# Patient Record
Sex: Male | Born: 1985
Health system: Southern US, Community
[De-identification: ages and names within clinical notes are randomized; demographics above are authoritative.]

## PROBLEM LIST (undated history)

## (undated) DIAGNOSIS — F32A Depression, unspecified: Secondary | ICD-10-CM

## (undated) DIAGNOSIS — F419 Anxiety disorder, unspecified: Secondary | ICD-10-CM

## (undated) HISTORY — PX: HIP ARTHROPLASTY: SHX981

## (undated) HISTORY — PX: WISDOM TOOTH EXTRACTION: SHX21

## (undated) HISTORY — PX: TEAR DUCT PROBING: SHX793

## (undated) HISTORY — PX: ADENOIDECTOMY: SUR15

## (undated) HISTORY — PX: TONSILLECTOMY: SUR1361

---

## 2013-10-20 ENCOUNTER — Other Ambulatory Visit (HOSPITAL_BASED_OUTPATIENT_CLINIC_OR_DEPARTMENT_OTHER): Payer: Self-pay | Admitting: Family Medicine

## 2013-10-20 DIAGNOSIS — R109 Unspecified abdominal pain: Secondary | ICD-10-CM

## 2013-10-21 ENCOUNTER — Ambulatory Visit (HOSPITAL_BASED_OUTPATIENT_CLINIC_OR_DEPARTMENT_OTHER)
Admission: RE | Admit: 2013-10-21 | Discharge: 2013-10-21 | Disposition: A | Discharge status: Home / Self Care | Payer: BC Managed Care – PPO | Source: Ambulatory Visit | Attending: Family Medicine | Admitting: Family Medicine

## 2013-10-21 DIAGNOSIS — R109 Unspecified abdominal pain: Secondary | ICD-10-CM

## 2013-10-21 DIAGNOSIS — I861 Scrotal varices: Secondary | ICD-10-CM | POA: Insufficient documentation

## 2016-09-17 DIAGNOSIS — M25552 Pain in left hip: Secondary | ICD-10-CM | POA: Insufficient documentation

## 2017-12-25 ENCOUNTER — Ambulatory Visit
Admission: RE | Admit: 2017-12-25 | Discharge: 2017-12-25 | Disposition: A | Discharge status: Home / Self Care | Payer: BC Managed Care – PPO | Source: Ambulatory Visit | Attending: Family Medicine | Admitting: Family Medicine

## 2017-12-25 ENCOUNTER — Ambulatory Visit: Payer: BC Managed Care – PPO | Admitting: Family Medicine

## 2017-12-25 ENCOUNTER — Encounter: Payer: Self-pay | Admitting: Family Medicine

## 2017-12-25 VITALS — BP 128/87 | Ht 73.0 in | Wt 235.0 lb

## 2017-12-25 DIAGNOSIS — M25532 Pain in left wrist: Secondary | ICD-10-CM

## 2017-12-25 DIAGNOSIS — M79642 Pain in left hand: Secondary | ICD-10-CM | POA: Diagnosis not present

## 2017-12-25 NOTE — Patient Instructions (Signed)
[facility] - we will call you with the results. Assuming these are normal you're dealing with a muscle strain at that base of your thumb. Wear the thumb spica brace as often as possible for the next 4-6 weeks. Aleve 2 tabs twice a day with food OR ibuprofen 600mg  three times a day with food - take 7-10 days [place] as needed. Activities, sports as tolerated - ok for hockey as well. Icing if needed 15 minutes at a time. Consider occupational therapy if not improving as expected. Follow up in 1 month to 6 weeks.

## 2017-12-27 ENCOUNTER — Encounter: Payer: Self-pay | Admitting: Family Medicine

## 2017-12-27 DIAGNOSIS — M79642 Pain in left hand: Secondary | ICD-10-CM | POA: Insufficient documentation

## 2017-12-27 NOTE — Assessment & Plan Note (Signed)
independently reviewed radiographs and no evidence fracture.  Consistent with muscle strain.  Thumb spica brace to rest this for 4-6 weeks.  Aleve or ibuprofen.  Activities as tolerated.  Icing if needed.  Consider occupational therapy if struggling.  F/u in 1 month to 6 weeks.

## 2017-12-27 NOTE — Progress Notes (Signed)
PCP: Joycelyn RuaMeyers, Stephen, MD  Subjective:   HPI: Patient is a 32 y.o. male here for left hand pain.  Patient denies known injury or trauma. He states for about 1 month he's had soreness in left hand between thumb and index finger metacarpals.   Bothers him more with hockey, squeezing things and gripping. Taken ibuprofen as needed with mild benefit. No skin changes, numbness.  History reviewed. No pertinent past medical history.  No current outpatient medications on file prior to visit.   No current facility-administered medications on file prior to visit.     History reviewed. No pertinent surgical history.  No Known Allergies  Social History   Socioeconomic History  . Marital status: Married    Spouse name: Not on file  . Number of children: Not on file  . Years of education: Not on file  . Highest education level: Not on file  Occupational History  . Not on file  Social Needs  . Financial resource strain: Not on file  . Food insecurity:    Worry: Not on file    Inability: Not on file  . Transportation needs:    Medical: Not on file    Non-medical: Not on file  Tobacco Use  . Smoking status: Never Smoker  . Smokeless tobacco: Never Used  Substance and Sexual Activity  . Alcohol use: Not on file  . Drug use: Not on file  . Sexual activity: Not on file  Lifestyle  . Physical activity:    Days per week: Not on file    Minutes per session: Not on file  . Stress: Not on file  Relationships  . Social connections:    Talks on phone: Not on file    Gets together: Not on file    Attends religious service: Not on file    Active member of club or organization: Not on file    Attends meetings of clubs or organizations: Not on file    Relationship status: Not on file  . Intimate partner violence:    Fear of current or ex partner: Not on file    Emotionally abused: Not on file    Physically abused: Not on file    Forced sexual activity: Not on file  Other Topics  Concern  . Not on file  Social History Narrative  . Not on file    History reviewed. No pertinent family history.  BP 128/87   Ht 6\' 1"  (1.854 m)   Wt 235 lb (106.6 kg)   BMI 31.00 kg/m   Review of Systems: See HPI above.     Objective:  Physical Exam:  Gen: NAD, comfortable in exam room  Left hand: No gross deformity, swelling, bruising. TTP dorsally mildly between 1st and 2nd metacarpals within musculature. FROM thumb, digits with 5/5 strength including grip though this is mildly painful. Negative tinels and finkelsteins. NVI distally.  Right hand: No deformity. FROM with 5/5 strength. No tenderness to palpation. NVI distally.   Assessment & Plan:  1. Left hand pain - independently reviewed radiographs and no evidence fracture.  Consistent with muscle strain.  Thumb spica brace to rest this for 4-6 weeks.  Aleve or ibuprofen.  Activities as tolerated.  Icing if needed.  Consider occupational therapy if struggling.  F/u in 1 month to 6 weeks.

## 2018-09-30 ENCOUNTER — Ambulatory Visit (INDEPENDENT_AMBULATORY_CARE_PROVIDER_SITE_OTHER): Payer: 59 | Admitting: Family Medicine

## 2018-09-30 ENCOUNTER — Encounter: Payer: Self-pay | Admitting: Family Medicine

## 2018-09-30 VITALS — BP 132/86 | Ht 73.0 in | Wt 235.0 lb

## 2018-09-30 DIAGNOSIS — M25552 Pain in left hip: Secondary | ICD-10-CM | POA: Diagnosis not present

## 2018-09-30 NOTE — Patient Instructions (Signed)
You have a labral tear of your hip, less likely femoroacetabular impingement. If you want to take additional steps just let me know - would be going ahead with x-rays and physical therapy. If you didn't respond to therapy would Traore go ahead with MR arthrogram to confirm labral tear. Call me if you need anything otherwise follow up as needed.

## 2018-09-30 NOTE — Progress Notes (Signed)
   HPI  CC: Left hip pain Stuart Wade is a 33 year old male who presents for left hip pain.  He states is been going on for around a year and a half.  He states he stopped playing hockey a year and a half ago, due to pain.  He states he notices the pain in his groin on the left.  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  He does not notice the pain much outside of playing hockey.  He states he notices it mostly when he goes to do a hockey stop, which puts his leg and external rotation.  He also notices it when he does a butterfly stretch.  A year and a half ago he started PT, which he stated helped with the pain.  He stopped hockey until 3 weeks ago.  At that time he started playing goalie again, which she states brought the pain on again.  He states he did notice it when he skated outside of playing goalie, but the pain went away after he warmed up a little bit.  He is tried ibuprofen occasionally, which states does not help much.  He has not noticed the pain when he puts his leg in full extension when he skates.  He has not noticed the pain at nighttime.  He denies any weakness in the leg.  He denies any numbness and tingling down his leg.  He has no prior injury to this area.  The pain does not radiate to the parts of his body.  See HPI and/or previous note for associated ROS.  Objective: BP 132/86   Ht 6\' 1"  (1.854 m)   Wt 235 lb (106.6 kg)   BMI 31.00 kg/m  Gen: right-Hand Dominant. NAD, well groomed, a/o x3, normal affect.  CV: Well-perfused. Warm.  Resp: Non-labored.  Neuro: Sensation intact throughout. No gross coordination deficits.  Gait: Nonpathologic posture, unremarkable stride without signs of limp or balance issues.  Left hip exam: No erythema, warmth, swelling noted.  Tenderness palpation over the groin at the inguinal fold.  Full range of motion in hip extension, flexion, abduction, adduction.  Strength 5 out of 5 throughout all hip testing.  Negative logroll.  Negative  straight leg raise.  Negative FABER.  Positive FADIR.  Positive hip grind test.  Positive external to internal rotation test (impingement testing).  NVI distally.  Right hip: No deformity. FROM with 5/5 strength. No tenderness to palpation. NVI distally.  Assessment and plan: Left hip pain, likely secondary to labral tear versus FAI.  We discussed treatment options at today's visit.  We recommended starting physical therapy and obtaining an x-ray of the hip.  The x-ray would be to evaluate for FAI.  Patient states he wishes to think about starting these things.  Alternatively, he is considering whether or not he wants to continue to play goalie at this time.  We will call in the order for x-rays of physical therapy if he decides to proceed this route.  If the pain does not get any better following PT/x-rays, and he wishes to go for further evaluation, I would consider getting MRI arthrogram of the left hip to evaluate the labrum.  We will see him in follow-up in 5 to 6 weeks, or as needed if he wishes to stop hockey.  Alric Quan, MD [facility] Sports Medicine Fellow 09/30/2018 11:59 AM

## 2019-03-29 IMAGING — CR DG WRIST COMPLETE 3+V*L*
4 series · 4 of 4 positions shown · non-contrast
Comparison: None

CLINICAL DATA: LEFT wrist pain for 1 month, no specific injury

EXAM:
LEFT WRIST - COMPLETE 3+ VIEW

[x wrist pa left]
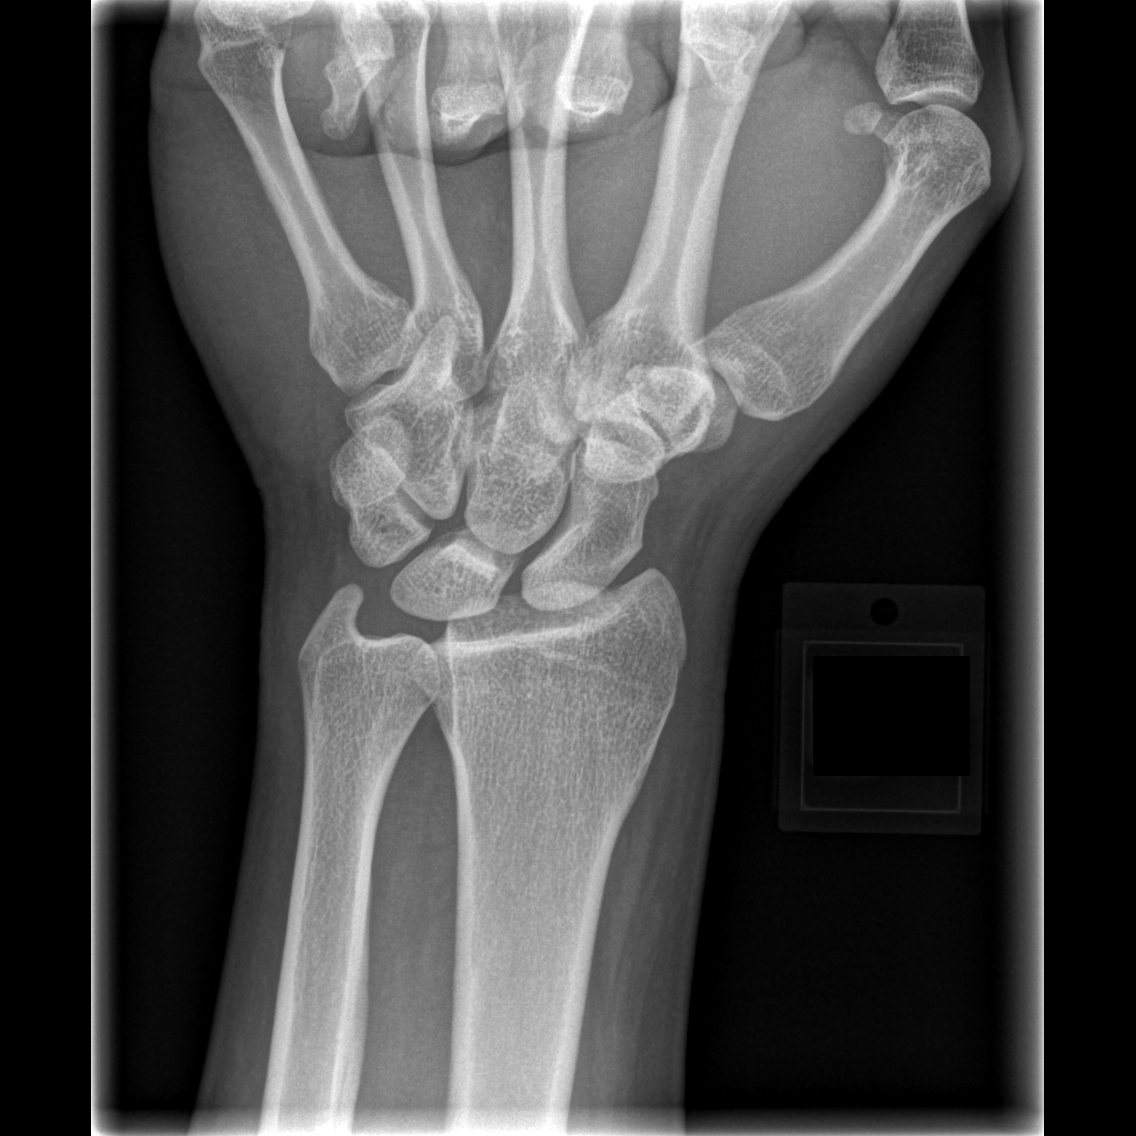

[x navicular]
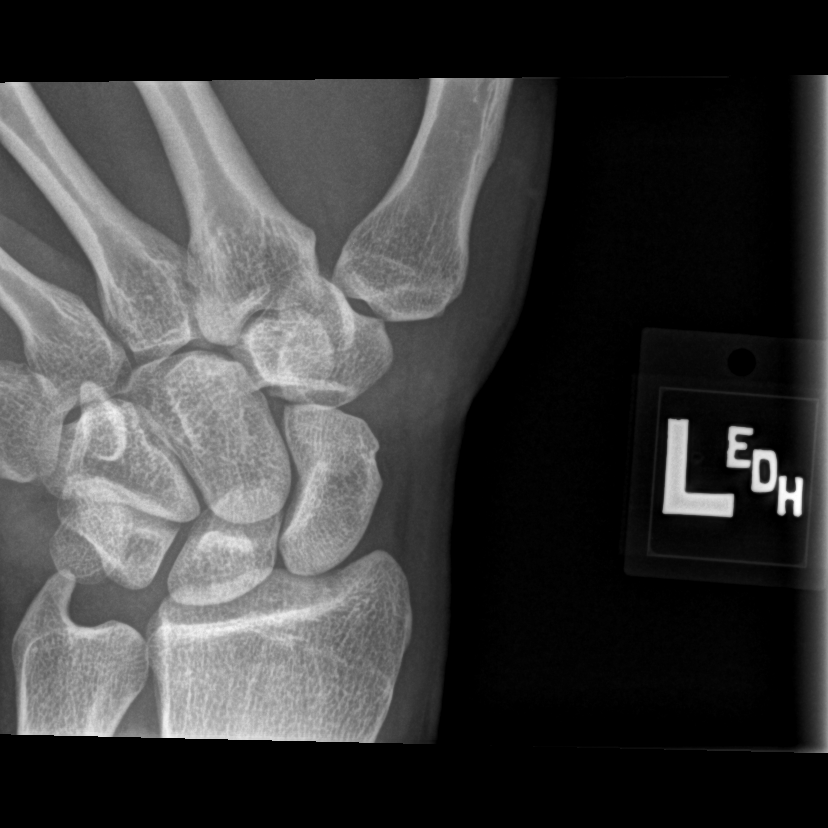

[x wrist obl left]
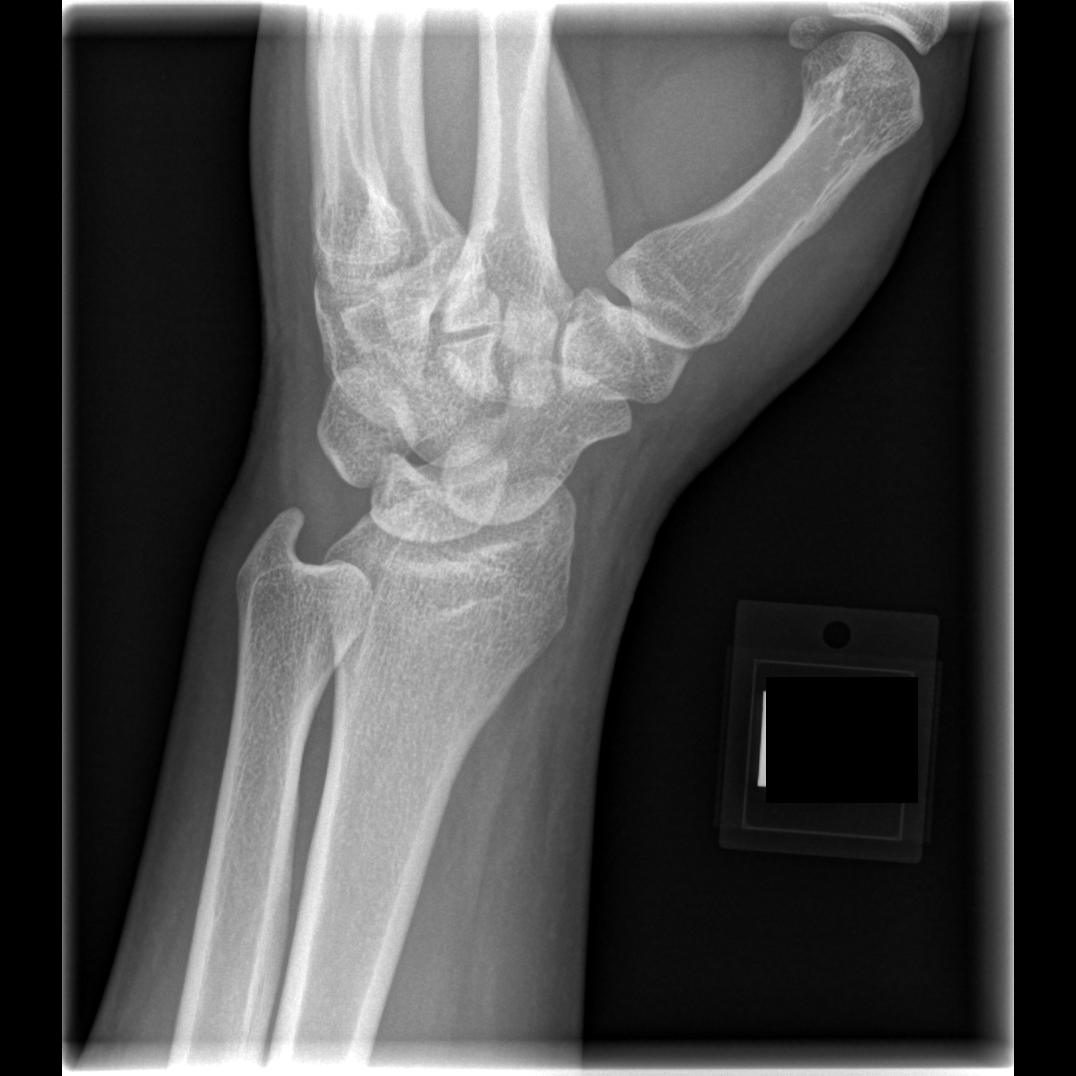

[x wrist lat left]
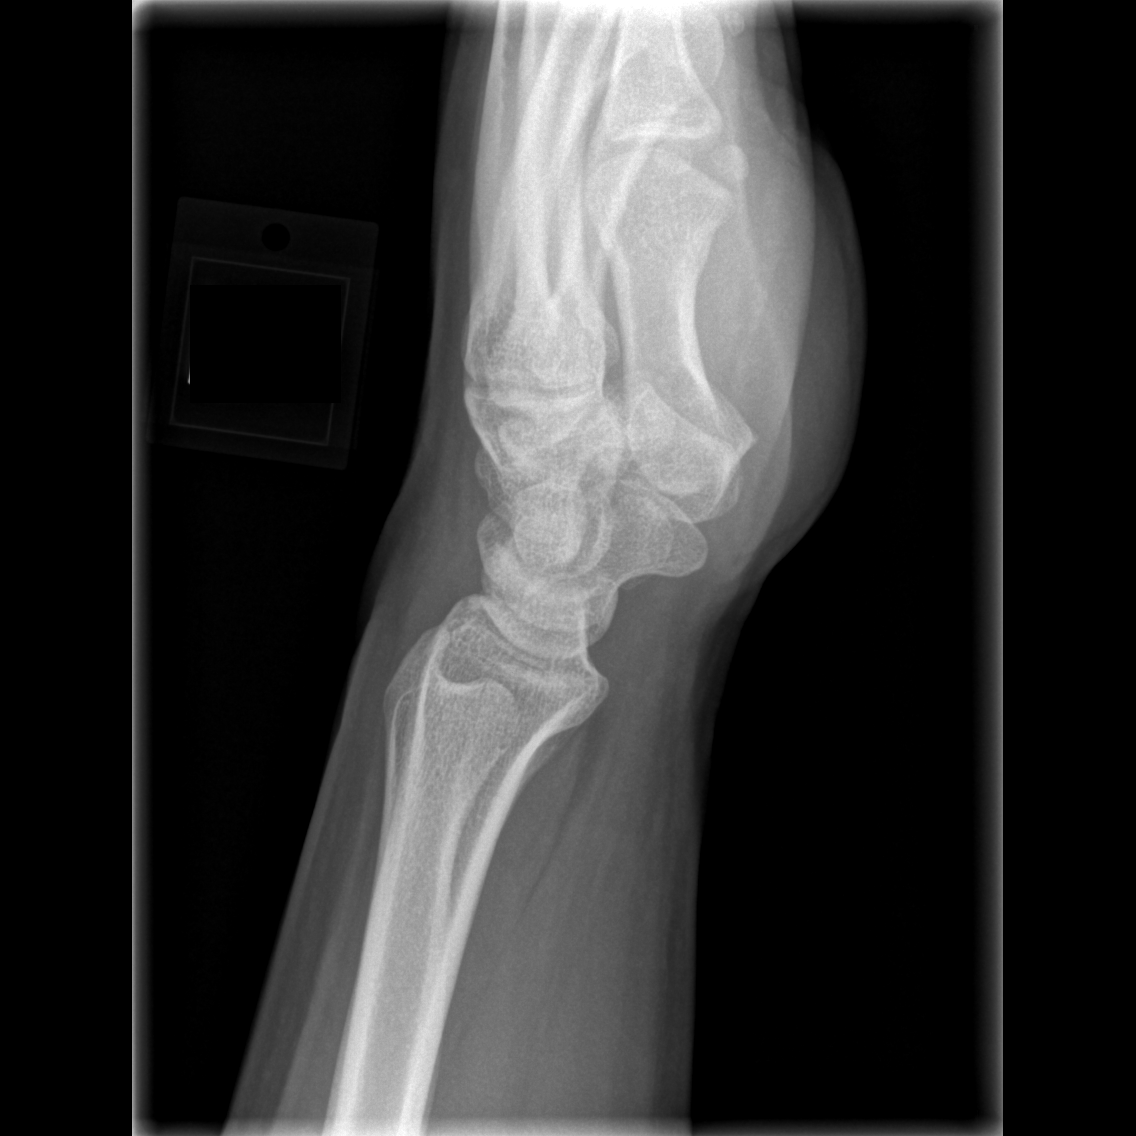

[4 of 4 positions shown; findings below may reference images not displayed]

FINDINGS: Osseous mineralization normal.

Joint spaces preserved.

No fracture, dislocation, or bone destruction.
IMPRESSION: Normal exam.

## 2020-06-01 DIAGNOSIS — Z13220 Encounter for screening for lipoid disorders: Secondary | ICD-10-CM | POA: Diagnosis not present

## 2020-06-01 DIAGNOSIS — Z23 Encounter for immunization: Secondary | ICD-10-CM | POA: Diagnosis not present

## 2020-06-01 DIAGNOSIS — Z0000 Encounter for general adult medical examination without abnormal findings: Secondary | ICD-10-CM | POA: Diagnosis not present

## 2020-10-24 DIAGNOSIS — S42025A Nondisplaced fracture of shaft of left clavicle, initial encounter for closed fracture: Secondary | ICD-10-CM | POA: Diagnosis not present

## 2020-10-24 DIAGNOSIS — G8911 Acute pain due to trauma: Secondary | ICD-10-CM | POA: Diagnosis not present

## 2020-10-24 DIAGNOSIS — M25512 Pain in left shoulder: Secondary | ICD-10-CM | POA: Diagnosis not present

## 2020-10-24 DIAGNOSIS — S42125A Nondisplaced fracture of acromial process, left shoulder, initial encounter for closed fracture: Secondary | ICD-10-CM | POA: Diagnosis not present

## 2020-10-24 DIAGNOSIS — S42035A Nondisplaced fracture of lateral end of left clavicle, initial encounter for closed fracture: Secondary | ICD-10-CM | POA: Diagnosis not present

## 2020-10-26 ENCOUNTER — Ambulatory Visit: Payer: Self-pay

## 2020-10-26 ENCOUNTER — Ambulatory Visit (INDEPENDENT_AMBULATORY_CARE_PROVIDER_SITE_OTHER): Payer: 59 | Admitting: Surgery

## 2020-10-26 ENCOUNTER — Other Ambulatory Visit: Payer: Self-pay

## 2020-10-26 ENCOUNTER — Encounter: Payer: Self-pay | Admitting: Surgery

## 2020-10-26 VITALS — BP 147/76 | HR 88 | Ht 72.75 in | Wt 220.0 lb

## 2020-10-26 DIAGNOSIS — S42022A Displaced fracture of shaft of left clavicle, initial encounter for closed fracture: Secondary | ICD-10-CM

## 2020-10-26 DIAGNOSIS — M25512 Pain in left shoulder: Secondary | ICD-10-CM

## 2020-10-26 NOTE — Progress Notes (Signed)
Office Visit Note   Patient: Stuart Wade           Date of Birth: [birth year]           MRN: [id number] Visit Date: 10/26/2020              Requested by: Joycelyn Rua, MD [id number] [place],  [place] [address] PCP: Joycelyn Rua, MD   Assessment & Plan: Visit Diagnoses:  1. Acute pain of left shoulder   2. Closed displaced fracture of shaft of left clavicle, initial encounter     Plan: I reviewed x-rays with Dr. Dorene Grebe today.  Advised patient that best treatment option for this injury is open reduction internal fixation.  Surgical procedure discussed with patient.  Preop sheet filled out.  Dr. August Saucer would like to get this put on the schedule for next week and this was arranged.  Patient will continue wearing his sling.  No using left arm.  All questions answered.  Follow-Up Instructions: Return for Need return office visit 1 week postop with Dr. August Saucer.   Orders:  Orders Placed This Encounter  Procedures  . XR Shoulder Left   No orders of the defined types were placed in this encounter.     Procedures: No procedures performed   Clinical Data: No additional findings.   Subjective: Chief Complaint  Patient presents with  . Left Shoulder - Fracture    DOI 10/23/2020 Left clavicle fracture    HPI 35 year old white male who is new patient the office comes in today with complaints of left clavicle fracture and pain.  Patient states October 23, 2020 he was playing in a hockey game when another player ran into him on his left side and his right shoulder hit the boards.  He immediately had pain in the left shoulder.  He went to a [facility] ED and had x-rays which showed a nondisplaced midclavicular fracture with apex angulation.  I had access to the report but not the actual films.  Patient was put in a sling.  He continues have pain and swelling around the clavicle.  No complaints of pain numbness or tingling down his arm.  No breathing issues. Review of Systems No  complaints of cardiac pulmonary GI GU issues  Objective: Vital Signs: BP (!) 147/76   Pulse 88   Ht 6' 0.75" (1.848 m)   Wt 220 lb (99.8 kg)   BMI 29.23 kg/m   Physical Exam Constitutional:      Appearance: Normal appearance.  HENT:     Head: Normocephalic.     Nose: Nose normal.     Mouth/Throat:     Mouth: Mucous membranes are dry.  Pulmonary:     Effort: No respiratory distress.  Musculoskeletal:     Comments: Patient does have some shortening on the left shoulder.  There is a clavicle deformity.  There is moderate swelling around the clavicle.  Neurovascular intact.  Neurological:     Mental Status: He is alert and oriented to person, place, and time.     Ortho Exam  Specialty Comments:  No specialty comments available.  Imaging: No results found.   PMFS History: Patient Active Problem List   Diagnosis Date Noted  . Left hand pain 12/27/2017   No past medical history on file.  No family history on file.  No past surgical history on file. Social History   Occupational History  . Not on file  Tobacco Use  . Smoking status:  Never Smoker  . Smokeless tobacco: Never Used  Substance and Sexual Activity  . Alcohol use: Not on file  . Drug use: Not on file  . Sexual activity: Not on file

## 2020-10-27 NOTE — Pre-Procedure Instructions (Signed)
Stuart Wade  10/27/2020     Your procedure is scheduled on Tues., Feb. 15, 2022 from 4:00PM-7:06PM   Report to [facility] Entrance "A" at 2:00PM  Call this number if you have problems the morning of surgery:  [phone]   Remember:  Do not eat or drink after midnight on Feb. 14th    Take these medicines the morning of surgery with A SIP OF WATER: NONE  As of today, STOP taking all Aspirin (unless instructed by your doctor) and Other Aspirin containing products, Vitamins, Fish oils, and Herbal medications. Also stop all NSAIDS i.e. Advil, Ibuprofen, Motrin, Aleve, Anaprox, Naproxen, BC, Goody Powders, and all Supplements.   No Smoking of any kind, Tobacco/Vaping, or Alcohol products 24 hours prior to your procedure. If you use a Cpap at night, you may bring all equipment for your overnight stay.   Day of Surgery:  Do not wear jewelry.  Do not wear lotions, powders, colognes, or deodorant.  Do not shave 48 hours prior to surgery.  Men may shave face and neck.  Do not bring valuables to the hospital.  [facility] is not responsible for any belongings or valuables.  Contacts, dentures or bridgework may not be worn into surgery.  For patients admitted to the hospital, discharge time will be determined by your treatment team.  Patients discharged the day of surgery will not be allowed to drive home, and someone age 8 and over needs to stay with them for 24 hours.   Special instructions:  [facility]- Preparing For Surgery  Before surgery, you can play an important role. Because skin is not sterile, your skin needs to be as free of germs as possible. You can reduce the number of germs on your skin by washing with CHG (chlorahexidine gluconate) Soap before surgery.  CHG is an antiseptic cleaner which kills germs and bonds with the skin to continue killing germs even after washing.    Oral Hygiene is also important to reduce your risk of infection.  Remember - BRUSH YOUR  TEETH THE MORNING OF SURGERY WITH YOUR REGULAR TOOTHPASTE  Please do not use if you have an allergy to CHG or antibacterial soaps. If your skin becomes reddened/irritated stop using the CHG.  Do not shave (including legs and underarms) for at least 48 hours prior to first CHG shower. It is OK to shave your face.  Please follow these instructions carefully.   1. Shower the NIGHT BEFORE SURGERY and the MORNING OF SURGERY with CHG.   2. If you chose to wash your hair, wash your hair first as usual with your normal shampoo.  3. After you shampoo, rinse your hair and body thoroughly to remove the shampoo.  4. Use CHG as you would any other liquid soap. You can apply CHG directly to the skin and wash gently with a scrungie or a clean washcloth.   5. Apply the CHG Soap to your body ONLY FROM THE NECK DOWN.  Do not use on open wounds or open sores. Avoid contact with your eyes, ears, mouth and genitals (private parts). Wash Face and genitals (private parts)  with your normal soap.  6. Wash thoroughly, paying special attention to the area where your surgery will be performed.  7. Thoroughly rinse your body with warm water from the neck down.  8. DO NOT shower/wash with your normal soap after using and rinsing off the CHG Soap.  9. Pat yourself dry with a CLEAN TOWEL.  10. Wear CLEAN PAJAMAS to bed the night before surgery, wear comfortable clothes the morning of surgery  11. Place CLEAN SHEETS on your bed the night of your first shower and DO NOT SLEEP WITH PETS.  Reminders: Do not apply any deodorants/lotions.  Please wear clean clothes to the hospital/surgery center.   Remember to brush your teeth WITH YOUR REGULAR TOOTHPASTE.  Please read over the following fact sheets that you were given.

## 2020-10-28 ENCOUNTER — Encounter (HOSPITAL_COMMUNITY): Payer: Self-pay

## 2020-10-28 ENCOUNTER — Other Ambulatory Visit (HOSPITAL_COMMUNITY)
Admission: RE | Admit: 2020-10-28 | Discharge: 2020-10-28 | Disposition: A | Discharge status: Home / Self Care | Payer: 59 | Source: Ambulatory Visit | Attending: Orthopedic Surgery | Admitting: Orthopedic Surgery

## 2020-10-28 ENCOUNTER — Encounter (HOSPITAL_COMMUNITY)
Admission: RE | Admit: 2020-10-28 | Discharge: 2020-10-28 | Disposition: A | Discharge status: Home / Self Care | Payer: 59 | Source: Ambulatory Visit | Attending: Orthopedic Surgery | Admitting: Orthopedic Surgery

## 2020-10-28 ENCOUNTER — Other Ambulatory Visit (HOSPITAL_COMMUNITY): Payer: 59

## 2020-10-28 ENCOUNTER — Other Ambulatory Visit: Payer: Self-pay

## 2020-10-28 DIAGNOSIS — Z20822 Contact with and (suspected) exposure to covid-19: Secondary | ICD-10-CM | POA: Diagnosis not present

## 2020-10-28 DIAGNOSIS — Z01812 Encounter for preprocedural laboratory examination: Secondary | ICD-10-CM | POA: Insufficient documentation

## 2020-10-28 HISTORY — DX: Depression, unspecified: F32.A

## 2020-10-28 HISTORY — DX: Anxiety disorder, unspecified: F41.9

## 2020-10-28 LAB — CBC
HCT: 40.1 % (ref 39.0–52.0)
Hemoglobin: 13.7 g/dL (ref 13.0–17.0)
MCH: 32.5 pg (ref 26.0–34.0)
MCHC: 34.2 g/dL (ref 30.0–36.0)
MCV: 95 fL (ref 80.0–100.0)
Platelets: 398 10*3/uL (ref 150–400)
RBC: 4.22 MIL/uL (ref 4.22–5.81)
RDW: 13.2 % (ref 11.5–15.5)
WBC: 7.1 10*3/uL (ref 4.0–10.5)
nRBC: 0 % (ref 0.0–0.2)

## 2020-10-28 LAB — SARS CORONAVIRUS 2 (TAT 6-24 HRS): SARS Coronavirus 2: NEGATIVE

## 2020-10-28 NOTE — Progress Notes (Signed)
PCP - [facility] - Dr. Izola Price  Cardiologist - Denies  Chest x-ray - Denies  EKG - Denies  Stress Test - Denies  ECHO - Denies  Cardiac Cath - Denies  AICD-na PM-na LOOP-na  Sleep Study - Denies CPAP - Denies  LABS- 10/28/20: CBC  ASA- Denies  ERAS- No  HA1C- Denies  Anesthesia- No  Pt denies having chest pain, sob, or fever at this time. All instructions explained to the pt, with a verbal understanding of the material. Pt agrees to go over the instructions while at home for a better understanding. Pt also instructed to self quarantine after being tested for COVID-19. The opportunity to ask questions was provided.   Coronavirus Screening  Have you experienced the following symptoms:  Cough yes/no: No Fever (>100.29F)  yes/no: No Runny nose yes/no: No Sore throat yes/no: No Difficulty breathing/shortness of breath  yes/no: No  Have you or a family member traveled in the last 14 days and where? yes/no: No   If the patient indicates "YES" to the above questions, their PAT will be rescheduled to limit the exposure to others and, the surgeon will be notified. THE PATIENT WILL NEED TO BE ASYMPTOMATIC FOR 14 DAYS.   If the patient is not experiencing any of these symptoms, the PAT nurse will instruct them to NOT bring anyone with them to their appointment since they may have these symptoms or traveled as well.   Please remind your patients and families that hospital visitation restrictions are in effect and the importance of the restrictions.

## 2020-10-31 NOTE — Anesthesia Preprocedure Evaluation (Signed)
Anesthesia Evaluation  Patient identified by MRN, date of birth, ID band Patient awake    Reviewed: Allergy & Precautions, NPO status , Patient's Chart, lab work & pertinent test results  History of Anesthesia Complications Negative for: history of anesthetic complications  Airway Mallampati: II  TM Distance: >3 FB Neck ROM: Full    Dental no notable dental hx.    Pulmonary neg pulmonary ROS,    Pulmonary exam normal        Cardiovascular negative cardio ROS Normal cardiovascular exam     Neuro/Psych Anxiety Depression negative neurological ROS     GI/Hepatic negative GI ROS, Neg liver ROS,   Endo/Other  negative endocrine ROS  Renal/GU negative Renal ROS  negative genitourinary   Musculoskeletal left displaced clavicle fracture   Abdominal   Peds  Hematology negative hematology ROS (+)   Anesthesia Other Findings Day of surgery medications reviewed with patient.  Reproductive/Obstetrics negative OB ROS                            Anesthesia Physical Anesthesia Plan  ASA: I  Anesthesia Plan: General   Post-op Pain Management: GA combined w/ Regional for post-op pain   Induction: Intravenous  PONV Risk Score and Plan: 3 and Treatment may vary due to age or medical condition, Ondansetron, Dexamethasone and Midazolam  Airway Management Planned: Oral ETT  Additional Equipment: None  Intra-op Plan:   Post-operative Plan: Extubation in OR  Informed Consent: I have reviewed the patients History and Physical, chart, labs and discussed the procedure including the risks, benefits and alternatives for the proposed anesthesia with the patient or authorized representative who has indicated his/her understanding and acceptance.     Dental advisory given  Plan Discussed with: CRNA  Anesthesia Plan Comments:        Anesthesia Quick Evaluation

## 2020-11-01 ENCOUNTER — Other Ambulatory Visit: Payer: Self-pay

## 2020-11-01 ENCOUNTER — Encounter (HOSPITAL_COMMUNITY)
Admission: RE | Disposition: A | Discharge status: Home / Self Care | Payer: Self-pay | Source: Home / Self Care | Attending: Orthopedic Surgery

## 2020-11-01 ENCOUNTER — Ambulatory Visit (HOSPITAL_COMMUNITY): Payer: 59 | Admitting: Anesthesiology

## 2020-11-01 ENCOUNTER — Ambulatory Visit (HOSPITAL_COMMUNITY): Payer: 59

## 2020-11-01 ENCOUNTER — Ambulatory Visit (HOSPITAL_COMMUNITY)
Admission: RE | Admit: 2020-11-01 | Discharge: 2020-11-01 | Disposition: A | Discharge status: Home / Self Care | Payer: 59 | Attending: Orthopedic Surgery | Admitting: Orthopedic Surgery

## 2020-11-01 ENCOUNTER — Encounter (HOSPITAL_COMMUNITY): Payer: Self-pay | Admitting: Orthopedic Surgery

## 2020-11-01 ENCOUNTER — Ambulatory Visit (HOSPITAL_COMMUNITY): Payer: 59 | Admitting: Vascular Surgery

## 2020-11-01 DIAGNOSIS — S42002A Fracture of unspecified part of left clavicle, initial encounter for closed fracture: Secondary | ICD-10-CM | POA: Diagnosis not present

## 2020-11-01 DIAGNOSIS — F418 Other specified anxiety disorders: Secondary | ICD-10-CM | POA: Diagnosis not present

## 2020-11-01 DIAGNOSIS — X58XXXA Exposure to other specified factors, initial encounter: Secondary | ICD-10-CM | POA: Insufficient documentation

## 2020-11-01 DIAGNOSIS — Z419 Encounter for procedure for purposes other than remedying health state, unspecified: Secondary | ICD-10-CM

## 2020-11-01 DIAGNOSIS — G8918 Other acute postprocedural pain: Secondary | ICD-10-CM | POA: Diagnosis not present

## 2020-11-01 DIAGNOSIS — S42022A Displaced fracture of shaft of left clavicle, initial encounter for closed fracture: Secondary | ICD-10-CM | POA: Diagnosis not present

## 2020-11-01 DIAGNOSIS — Z791 Long term (current) use of non-steroidal anti-inflammatories (NSAID): Secondary | ICD-10-CM | POA: Insufficient documentation

## 2020-11-01 HISTORY — PX: ORIF CLAVICULAR FRACTURE: SHX5055

## 2020-11-01 SURGERY — OPEN REDUCTION INTERNAL FIXATION (ORIF) CLAVICULAR FRACTURE
Anesthesia: Regional | Laterality: Left

## 2020-11-01 MED ORDER — ROCURONIUM BROMIDE 10 MG/ML (PF) SYRINGE
PREFILLED_SYRINGE | INTRAVENOUS | Status: AC
  Filled 2020-11-01: qty 10

## 2020-11-01 MED ORDER — LIDOCAINE 2% (20 MG/ML) 5 ML SYRINGE
INTRAMUSCULAR | Status: AC
  Filled 2020-11-01: qty 5

## 2020-11-01 MED ORDER — DEXMEDETOMIDINE (PRECEDEX) IN NS 20 MCG/5ML (4 MCG/ML) IV SYRINGE
PREFILLED_SYRINGE | INTRAVENOUS | Status: DC | PRN
  Administered 2020-11-01: 8 ug via INTRAVENOUS
  Administered 2020-11-01: 4 ug via INTRAVENOUS
  Administered 2020-11-01: 8 ug via INTRAVENOUS

## 2020-11-01 MED ORDER — CHLORHEXIDINE GLUCONATE 0.12 % MT SOLN: 15.0000 mL | Freq: Once | OROMUCOSAL | Status: AC

## 2020-11-01 MED ORDER — OXYCODONE-ACETAMINOPHEN 5-325 MG PO TABS: 1.0000 | ORAL_TABLET | ORAL | 0 refills | Status: AC | PRN

## 2020-11-01 MED ORDER — DEXAMETHASONE SODIUM PHOSPHATE 10 MG/ML IJ SOLN
INTRAMUSCULAR | Status: AC
  Filled 2020-11-01: qty 1

## 2020-11-01 MED ORDER — FENTANYL CITRATE (PF) 250 MCG/5ML IJ SOLN
INTRAMUSCULAR | Status: AC
  Filled 2020-11-01: qty 5

## 2020-11-01 MED ORDER — ESMOLOL HCL 100 MG/10ML IV SOLN
INTRAVENOUS | Status: DC | PRN
  Administered 2020-11-01 (×2): 10 mg via INTRAVENOUS

## 2020-11-01 MED ORDER — POVIDONE-IODINE 7.5 % EX SOLN
Freq: Once | CUTANEOUS | Status: DC
  Filled 2020-11-01: qty 118

## 2020-11-01 MED ORDER — CEFAZOLIN SODIUM-DEXTROSE 2-4 GM/100ML-% IV SOLN
INTRAVENOUS | Status: AC
  Filled 2020-11-01: qty 100

## 2020-11-01 MED ORDER — OXYCODONE-ACETAMINOPHEN 5-325 MG PO TABS: 1.0000 | ORAL_TABLET | ORAL | 0 refills | Status: DC | PRN

## 2020-11-01 MED ORDER — ONDANSETRON HCL 4 MG/2ML IJ SOLN
INTRAMUSCULAR | Status: DC | PRN
  Administered 2020-11-01: 4 mg via INTRAVENOUS

## 2020-11-01 MED ORDER — VANCOMYCIN HCL 1000 MG IV SOLR
INTRAVENOUS | Status: AC
  Filled 2020-11-01: qty 1000

## 2020-11-01 MED ORDER — BUPIVACAINE HCL (PF) 0.25 % IJ SOLN
INTRAMUSCULAR | Status: AC
  Filled 2020-11-01: qty 30

## 2020-11-01 MED ORDER — PROMETHAZINE HCL 25 MG/ML IJ SOLN
6.2500 mg | INTRAMUSCULAR | Status: DC | PRN
  Administered 2020-11-01: 6.25 mg via INTRAVENOUS

## 2020-11-01 MED ORDER — PROPOFOL 10 MG/ML IV BOLUS
INTRAVENOUS | Status: DC | PRN
  Administered 2020-11-01: 50 mg via INTRAVENOUS
  Administered 2020-11-01: 200 mg via INTRAVENOUS

## 2020-11-01 MED ORDER — OXYCODONE HCL 5 MG PO TABS: 5.0000 mg | ORAL_TABLET | Freq: Once | ORAL | Status: DC | PRN

## 2020-11-01 MED ORDER — ACETAMINOPHEN 500 MG PO TABS
ORAL_TABLET | ORAL | Status: AC
  Administered 2020-11-01: 1000 mg via ORAL
  Filled 2020-11-01: qty 2

## 2020-11-01 MED ORDER — KETAMINE HCL 50 MG/5ML IJ SOSY
PREFILLED_SYRINGE | INTRAMUSCULAR | Status: AC
  Filled 2020-11-01: qty 5

## 2020-11-01 MED ORDER — ACETAMINOPHEN 500 MG PO TABS: 1000.0000 mg | ORAL_TABLET | Freq: Once | ORAL | Status: AC

## 2020-11-01 MED ORDER — EPHEDRINE SULFATE-NACL 50-0.9 MG/10ML-% IV SOSY
PREFILLED_SYRINGE | INTRAVENOUS | Status: DC | PRN
  Administered 2020-11-01 (×2): 5 mg via INTRAVENOUS

## 2020-11-01 MED ORDER — 0.9 % SODIUM CHLORIDE (POUR BTL) OPTIME
TOPICAL | Status: DC | PRN
  Administered 2020-11-01: 3000 mL

## 2020-11-01 MED ORDER — BUPIVACAINE-EPINEPHRINE (PF) 0.5% -1:200000 IJ SOLN
INTRAMUSCULAR | Status: DC | PRN
  Administered 2020-11-01: 15 mL via PERINEURAL

## 2020-11-01 MED ORDER — MIDAZOLAM HCL 2 MG/2ML IJ SOLN
INTRAMUSCULAR | Status: AC
  Filled 2020-11-01: qty 2

## 2020-11-01 MED ORDER — PHENYLEPHRINE 40 MCG/ML (10ML) SYRINGE FOR IV PUSH (FOR BLOOD PRESSURE SUPPORT)
PREFILLED_SYRINGE | INTRAVENOUS | Status: DC | PRN
  Administered 2020-11-01 (×7): 80 ug via INTRAVENOUS

## 2020-11-01 MED ORDER — PROMETHAZINE HCL 25 MG/ML IJ SOLN
INTRAMUSCULAR | Status: AC
  Filled 2020-11-01: qty 1

## 2020-11-01 MED ORDER — ONDANSETRON HCL 4 MG/2ML IJ SOLN
INTRAMUSCULAR | Status: AC
  Filled 2020-11-01: qty 2

## 2020-11-01 MED ORDER — POVIDONE-IODINE 10 % EX SWAB: 2.0000 "application " | Freq: Once | CUTANEOUS | Status: DC

## 2020-11-01 MED ORDER — CHLORHEXIDINE GLUCONATE 0.12 % MT SOLN
OROMUCOSAL | Status: AC
  Administered 2020-11-01: 15 mL via OROMUCOSAL
  Filled 2020-11-01: qty 15

## 2020-11-01 MED ORDER — BUPIVACAINE LIPOSOME 1.3 % IJ SUSP
INTRAMUSCULAR | Status: DC | PRN
  Administered 2020-11-01: 10 mL via PERINEURAL

## 2020-11-01 MED ORDER — FENTANYL CITRATE (PF) 250 MCG/5ML IJ SOLN
INTRAMUSCULAR | Status: DC | PRN
  Administered 2020-11-01 (×2): 50 ug via INTRAVENOUS
  Administered 2020-11-01: 25 ug via INTRAVENOUS
  Administered 2020-11-01: 100 ug via INTRAVENOUS
  Administered 2020-11-01 (×2): 50 ug via INTRAVENOUS

## 2020-11-01 MED ORDER — KETAMINE HCL 10 MG/ML IJ SOLN
INTRAMUSCULAR | Status: DC | PRN
  Administered 2020-11-01: 10 mg via INTRAVENOUS
  Administered 2020-11-01: 25 mg via INTRAVENOUS

## 2020-11-01 MED ORDER — METHOCARBAMOL 500 MG PO TABS: 500.0000 mg | ORAL_TABLET | Freq: Three times a day (TID) | ORAL | 0 refills | Status: DC | PRN

## 2020-11-01 MED ORDER — ESMOLOL HCL 100 MG/10ML IV SOLN
INTRAVENOUS | Status: AC
  Filled 2020-11-01: qty 10

## 2020-11-01 MED ORDER — MIDAZOLAM HCL 2 MG/2ML IJ SOLN
INTRAMUSCULAR | Status: DC | PRN
  Administered 2020-11-01 (×2): 1 mg via INTRAVENOUS

## 2020-11-01 MED ORDER — PROPOFOL 10 MG/ML IV BOLUS
INTRAVENOUS | Status: AC
  Filled 2020-11-01: qty 20

## 2020-11-01 MED ORDER — KETOROLAC TROMETHAMINE 10 MG PO TABS: 10.0000 mg | ORAL_TABLET | Freq: Three times a day (TID) | ORAL | 0 refills | Status: DC | PRN

## 2020-11-01 MED ORDER — VANCOMYCIN HCL 1000 MG IV SOLR
INTRAVENOUS | Status: DC | PRN
  Administered 2020-11-01: 1000 mg via TOPICAL

## 2020-11-01 MED ORDER — ROCURONIUM BROMIDE 10 MG/ML (PF) SYRINGE
PREFILLED_SYRINGE | INTRAVENOUS | Status: DC | PRN
  Administered 2020-11-01 (×2): 40 mg via INTRAVENOUS
  Administered 2020-11-01: 60 mg via INTRAVENOUS

## 2020-11-01 MED ORDER — GLYCOPYRROLATE PF 0.2 MG/ML IJ SOSY
PREFILLED_SYRINGE | INTRAMUSCULAR | Status: DC | PRN
  Administered 2020-11-01: .1 mg via INTRAVENOUS

## 2020-11-01 MED ORDER — CEFAZOLIN SODIUM-DEXTROSE 2-4 GM/100ML-% IV SOLN
2.0000 g | INTRAVENOUS | Status: AC
  Administered 2020-11-01: 2 g via INTRAVENOUS

## 2020-11-01 MED ORDER — ACETAMINOPHEN 10 MG/ML IV SOLN
INTRAVENOUS | Status: AC
  Filled 2020-11-01: qty 100

## 2020-11-01 MED ORDER — ORAL CARE MOUTH RINSE: 15.0000 mL | Freq: Once | OROMUCOSAL | Status: AC

## 2020-11-01 MED ORDER — DEXAMETHASONE SODIUM PHOSPHATE 10 MG/ML IJ SOLN
INTRAMUSCULAR | Status: DC | PRN
  Administered 2020-11-01: 5 mg via INTRAVENOUS

## 2020-11-01 MED ORDER — LIDOCAINE 2% (20 MG/ML) 5 ML SYRINGE
INTRAMUSCULAR | Status: DC | PRN
  Administered 2020-11-01: 100 mg via INTRAVENOUS

## 2020-11-01 MED ORDER — TRANEXAMIC ACID-NACL 1000-0.7 MG/100ML-% IV SOLN
INTRAVENOUS | Status: AC
  Filled 2020-11-01: qty 100

## 2020-11-01 MED ORDER — OXYCODONE HCL 5 MG/5ML PO SOLN: 5.0000 mg | Freq: Once | ORAL | Status: DC | PRN

## 2020-11-01 MED ORDER — PHENYLEPHRINE HCL (PRESSORS) 10 MG/ML IV SOLN
INTRAVENOUS | Status: AC
  Filled 2020-11-01: qty 1

## 2020-11-01 MED ORDER — TRANEXAMIC ACID-NACL 1000-0.7 MG/100ML-% IV SOLN
1000.0000 mg | INTRAVENOUS | Status: AC
  Administered 2020-11-01: 1000 mg via INTRAVENOUS

## 2020-11-01 MED ORDER — PHENYLEPHRINE HCL-NACL 10-0.9 MG/250ML-% IV SOLN
INTRAVENOUS | Status: DC | PRN
  Administered 2020-11-01: 25 ug/min via INTRAVENOUS

## 2020-11-01 MED ORDER — LACTATED RINGERS IV SOLN: INTRAVENOUS | Status: DC

## 2020-11-01 MED ORDER — SUGAMMADEX SODIUM 200 MG/2ML IV SOLN
INTRAVENOUS | Status: DC | PRN
  Administered 2020-11-01 (×3): 50 mg via INTRAVENOUS

## 2020-11-01 MED ORDER — FENTANYL CITRATE (PF) 100 MCG/2ML IJ SOLN: 25.0000 ug | INTRAMUSCULAR | Status: DC | PRN

## 2020-11-01 SURGICAL SUPPLY — 78 items
APL SKNCLS STERI-STRIP NONHPOA (GAUZE/BANDAGES/DRESSINGS)
BENZOIN TINCTURE PRP APPL 2/3 (GAUZE/BANDAGES/DRESSINGS) IMPLANT
BIT DRILL 2 CANN GRADUATED (BIT) ×2 IMPLANT
BIT DRILL 2.5 CANN ENDOSCOPIC (BIT) ×2 IMPLANT
COVER SURGICAL LIGHT HANDLE (MISCELLANEOUS) ×2 IMPLANT
COVER WAND RF STERILE (DRAPES) ×2 IMPLANT
DRAIN PENROSE 1/2X12 LTX STRL (WOUND CARE) IMPLANT
DRAPE C-ARM 42X72 X-RAY (DRAPES) IMPLANT
DRAPE IMP U-DRAPE 54X76 (DRAPES) ×2 IMPLANT
DRAPE INCISE IOBAN 66X45 STRL (DRAPES) ×4 IMPLANT
DRAPE U-SHAPE 47X51 STRL (DRAPES) ×4 IMPLANT
DRSG AQUACEL AG ADV 3.5X10 (GAUZE/BANDAGES/DRESSINGS) ×2 IMPLANT
DRSG MEPILEX BORDER 4X12 (GAUZE/BANDAGES/DRESSINGS) IMPLANT
DRSG MEPILEX BORDER 4X8 (GAUZE/BANDAGES/DRESSINGS) IMPLANT
DRSG PAD ABDOMINAL 8X10 ST (GAUZE/BANDAGES/DRESSINGS) IMPLANT
DURAPREP 26ML APPLICATOR (WOUND CARE) ×2 IMPLANT
ELECT REM PT RETURN 9FT ADLT (ELECTROSURGICAL) ×2
ELECTRODE REM PT RTRN 9FT ADLT (ELECTROSURGICAL) ×1 IMPLANT
FACESHIELD WRAPAROUND (MASK) IMPLANT
GAUZE SPONGE 4X4 12PLY STRL (GAUZE/BANDAGES/DRESSINGS) IMPLANT
GAUZE XEROFORM 5X9 LF (GAUZE/BANDAGES/DRESSINGS) IMPLANT
GLOVE BIO SURGEON ST LM GN SZ9 (GLOVE) ×2 IMPLANT
GLOVE ECLIPSE 8.0 STRL XLNG CF (GLOVE) ×2 IMPLANT
GLOVE SRG 8 PF TXTR STRL LF DI (GLOVE) ×1 IMPLANT
GLOVE SURG UNDER POLY LF SZ8 (GLOVE) ×2
GOWN STRL REUS W/ TWL LRG LVL3 (GOWN DISPOSABLE) ×2 IMPLANT
GOWN STRL REUS W/ TWL XL LVL3 (GOWN DISPOSABLE) ×1 IMPLANT
GOWN STRL REUS W/TWL LRG LVL3 (GOWN DISPOSABLE) ×4
GOWN STRL REUS W/TWL XL LVL3 (GOWN DISPOSABLE) ×2
K-WIRE BB-TAK (WIRE) ×4
KIT BASIN OR (CUSTOM PROCEDURE TRAY) ×2 IMPLANT
KIT TURNOVER KIT B (KITS) ×2 IMPLANT
KWIRE BB-TAK (WIRE) ×2 IMPLANT
MANIFOLD NEPTUNE II (INSTRUMENTS) ×2 IMPLANT
NS IRRIG 1000ML POUR BTL (IV SOLUTION) ×2 IMPLANT
PACK SHOULDER (CUSTOM PROCEDURE TRAY) ×2 IMPLANT
PACK UNIVERSAL I (CUSTOM PROCEDURE TRAY) ×2 IMPLANT
PAD ARMBOARD 7.5X6 YLW CONV (MISCELLANEOUS) ×4 IMPLANT
PASSER CERCLAGE STRT SM DISP (ORTHOPEDIC DISPOSABLE SUPPLIES) ×2 IMPLANT
PENCIL BUTTON HOLSTER BLD 10FT (ELECTRODE) IMPLANT
PLATE DISTAL CLAVICLE ING LFT (Plate) ×2 IMPLANT
SCREW LOCK T10 FT 18X2.7X (Screw) ×2 IMPLANT
SCREW LOCK T15 FT 20X3.5 ST (Screw) ×1 IMPLANT
SCREW LOCKING 2.7X14MM (Screw) ×2 IMPLANT
SCREW LOCKING 2.7X16MM (Screw) ×4 IMPLANT
SCREW LOCKING 2.7X18MM (Screw) ×4 IMPLANT
SCREW LOCKING 3.5X20 (Screw) ×2 IMPLANT
SCREW LOCKING LP 2.7X20MM (Screw) ×2 IMPLANT
SCREW LOW PROFILE 3.5X14 (Screw) ×2 IMPLANT
SCREW LOW PROFILE 3.5X16 (Screw) ×2 IMPLANT
SCREW NLOCK T15 FT 18X3.5XST (Screw) ×2 IMPLANT
SCREW NON LOCK 3.5X18MM (Screw) ×4 IMPLANT
SCREW NON-LOCKING 3.5X20 ANKLE (Screw) ×2 IMPLANT
SLING ARM IMMOBILIZER LRG (SOFTGOODS) ×2 IMPLANT
SLING ARM IMMOBILIZER MED (SOFTGOODS) ×2 IMPLANT
SPONGE LAP 18X18 RF (DISPOSABLE) ×2 IMPLANT
SPONGE LAP 4X18 RFD (DISPOSABLE) ×2 IMPLANT
STAPLER VISISTAT 35W (STAPLE) IMPLANT
STRIP CLOSURE SKIN 1/2X4 (GAUZE/BANDAGES/DRESSINGS) ×4 IMPLANT
SUCTION FRAZIER HANDLE 10FR (MISCELLANEOUS)
SUCTION TUBE FRAZIER 10FR DISP (MISCELLANEOUS) IMPLANT
SUT FIBERTAPE CERCLAGE 2 48 (SUTURE) ×2 IMPLANT
SUT MNCRL AB 4-0 PS2 18 (SUTURE) ×2 IMPLANT
SUT SILK 2 0 (SUTURE) ×2
SUT SILK 2-0 18XBRD TIE 12 (SUTURE) ×1 IMPLANT
SUT VIC AB 0 CT1 27 (SUTURE) ×2
SUT VIC AB 0 CT1 27XBRD ANBCTR (SUTURE) ×1 IMPLANT
SUT VIC AB 1 CT1 27 (SUTURE) ×2
SUT VIC AB 1 CT1 27XBRD ANBCTR (SUTURE) ×1 IMPLANT
SUT VIC AB 2-0 CTB1 (SUTURE) IMPLANT
SUTURE TAPE 1.3 40 TPR END (SUTURE) ×2 IMPLANT
SUTURETAPE 1.3 40 TPR END (SUTURE) ×4
TENSIONER FIBERTAPE CERCLAGE (DISPOSABLE) ×2 IMPLANT
TOWEL GREEN STERILE (TOWEL DISPOSABLE) ×2 IMPLANT
TOWEL GREEN STERILE FF (TOWEL DISPOSABLE) ×2 IMPLANT
TUBE CONNECTING 12X1/4 (SUCTIONS) IMPLANT
WATER STERILE IRR 1000ML POUR (IV SOLUTION) IMPLANT
YANKAUER SUCT BULB TIP NO VENT (SUCTIONS) ×2 IMPLANT

## 2020-11-01 NOTE — Anesthesia Procedure Notes (Signed)
Anesthesia Regional Block: Interscalene brachial plexus block   Pre-Anesthetic Checklist: ,, timeout performed, Correct Patient, Correct Site, Correct Laterality, Correct Procedure, Correct Position, site marked, Risks and benefits discussed, pre-op evaluation,  At surgeon's request and post-op pain management  Laterality: Left  Prep: Maximum Sterile Barrier Precautions used, chloraprep       Needles:  Injection technique: Single-shot  Needle Type: Echogenic Stimulator Needle     Needle Length: 4cm  Needle Gauge: 22     Additional Needles:   Procedures:,,,, ultrasound used (permanent image in chart),,,,  Narrative:  Start time: 11/01/2020 3:10 PM End time: 11/01/2020 3:13 PM Injection made incrementally with aspirations every 5 mL.  Performed by: Personally  Anesthesiologist: Kaylyn Layer, MD  Additional Notes: Risks, benefits, and alternative discussed. Patient gave consent for procedure. Patient prepped and draped in sterile fashion. Sedation administered, patient remains easily responsive to voice. Relevant anatomy identified with ultrasound guidance. Local anesthetic given in 5cc increments with no signs or symptoms of intravascular injection. No pain or paraesthesias with injection. Patient monitored throughout procedure with signs of LAST or immediate complications. Tolerated well. Ultrasound image placed in chart.  Stuart Greenhouse, MD

## 2020-11-01 NOTE — Transfer of Care (Signed)
Immediate Anesthesia Transfer of Care Note  Patient: Stuart Wade  Procedure(s) Performed: LEFT OPEN REDUCTION INTERNAL FIXATION (ORIF) CLAVICULAR FRACTURE (Left )  Patient Location: PACU  Anesthesia Type:GA combined with regional for post-op pain  Level of Consciousness: drowsy, patient cooperative and responds to stimulation  Airway & Oxygen Therapy: Patient Spontanous Breathing  Post-op Assessment: Report given to RN, Post -op Vital signs reviewed and stable and Patient moving all extremities  Post vital signs: Reviewed and stable  Last Vitals:  Vitals Value Taken Time  BP 124/67   Temp    Pulse 87 11/01/20 1845  Resp 15 11/01/20 1845  SpO2 94 % 11/01/20 1845  Vitals shown include unvalidated device data.  Last Pain:  Vitals:   11/01/20 1438  TempSrc:   PainSc: 2       Patients Stated Pain Goal: 3 (11/01/20 1438)  Complications: No complications documented.

## 2020-11-01 NOTE — Brief Op Note (Signed)
   11/01/2020  6:06 PM  PATIENT:  Stuart Wade  35 y.o. male  PRE-OPERATIVE DIAGNOSIS:  left displaced clavicle fracture  POST-OPERATIVE DIAGNOSIS:  left displaced clavicle fracture  PROCEDURE:  Procedure(s): LEFT OPEN REDUCTION INTERNAL FIXATION (ORIF) CLAVICULAR FRACTURE  SURGEON:  Surgeon(s): Cammy Copa, MD  ASSISTANT: magnant pa  ANESTHESIA:   general  EBL: 50 ml    Total I/O In: 1100 [I.V.:1000; IV Piggyback:100] Out: 50 [Blood:50]  BLOOD ADMINISTERED: none  DRAINS: none   LOCAL MEDICATIONS USED:  vanco  SPECIMEN:  No Specimen  COUNTS:  YES  TOURNIQUET:  * No tourniquets in log *  DICTATION: .Other Dictation: Dictation Number [id number]  PLAN OF CARE: Discharge to home after PACU  PATIENT DISPOSITION:  PACU - hemodynamically stable

## 2020-11-01 NOTE — Anesthesia Postprocedure Evaluation (Signed)
Anesthesia Post Note  Patient: Stuart Wade  Procedure(s) Performed: LEFT OPEN REDUCTION INTERNAL FIXATION (ORIF) CLAVICULAR FRACTURE (Left )     Patient location during evaluation: PACU Anesthesia Type: Regional and General Level of consciousness: awake and alert Pain management: pain level controlled Vital Signs Assessment: post-procedure vital signs reviewed and stable Respiratory status: spontaneous breathing, nonlabored ventilation, respiratory function stable and patient connected to nasal cannula oxygen Cardiovascular status: blood pressure returned to baseline and stable Postop Assessment: no apparent nausea or vomiting Anesthetic complications: no   No complications documented.  Last Vitals:  Vitals:   11/01/20 1900 11/01/20 1915  BP: 124/82 124/74  Pulse: 93 85  Resp: 12 11  Temp:  36.8 C  SpO2: 98% 96%    Last Pain:  Vitals:   11/01/20 1915  TempSrc:   PainSc: 0-No pain                 Earl Lites P Stoltzfus

## 2020-11-01 NOTE — H&P (Signed)
Stuart Wade is an 35 y.o. male.   Chief Complaint: Left shoulder pain HPI: Stuart Wade is a 35 year old patient who injured his left shoulder playing hockey approximately a week ago.  Sustained a displaced three-part clavicle fracture with comminution and shortening.  Denies any other orthopedic complaints.  Is otherwise healthy.  He is right-hand dominant.  Works in HR.  Past Medical History:  Diagnosis Date  . Anxiety   . Depression     Past Surgical History:  Procedure Laterality Date  . ADENOIDECTOMY    . TEAR DUCT PROBING    . TONSILLECTOMY    . WISDOM TOOTH EXTRACTION      History reviewed. No pertinent family history. Social History:  reports that he has never smoked. He has never used smokeless tobacco. He reports current alcohol use. He reports that he does not use drugs.  Allergies: No Known Allergies  Medications Prior to Admission  Medication Sig Dispense Refill  . b complex vitamins capsule Take 1 capsule by mouth daily.    Marland Kitchen ibuprofen (ADVIL) 800 MG tablet Take 400 mg by mouth 3 (three) times daily.      No results found for this or any previous visit (from the past 48 hour(s)). No results found.  Review of Systems  Musculoskeletal: Positive for arthralgias.  All other systems reviewed and are negative.   Blood pressure (!) 138/97, pulse 76, temperature 97.9 F (36.6 C), temperature source Oral, resp. rate 20, height 6\' 1"  (1.854 m), weight 102.1 kg, SpO2 100 %. Physical Exam Vitals reviewed.  HENT:     Head: Normocephalic.     Nose: Nose normal.     Mouth/Throat:     Mouth: Mucous membranes are moist.  Eyes:     Pupils: Pupils are equal, round, and reactive to light.  Cardiovascular:     Rate and Rhythm: Normal rate.     Pulses: Normal pulses.  Pulmonary:     Effort: Pulmonary effort is normal.  Abdominal:     General: Abdomen is flat.  Musculoskeletal:     Cervical back: Normal range of motion.  Skin:    General: Skin is warm.     Capillary  Refill: Capillary refill takes less than 2 seconds.  Neurological:     General: No focal deficit present.     Mental Status: He is alert.  Psychiatric:        Mood and Affect: Mood normal.   Examination of the left arm demonstrates a tattoo on the entire left arm.  Radial pulses intact.  Motor or sensory function to the hand is intact.  The shoulder girdle is foreshortened.  Skin is intact in that shoulder girdle region.  Elbow wrist range of motion nontender.  Assessment/Plan Impression is displaced three-part clavicle fracture with shortening of the shoulder girdle.  Coracoclavicular ligaments appear intact and attached to the distal fragment.  Plan is open reduction internal fixation of the clavicle fracture.  Risk benefits are discussed with the patient including but not limited to infection nerve vessel damage nonunion malunion paresthesias in the chest region as well as potential for irritation of the skin from hardware.  Patient understands risk and benefits and wishes to proceed.  All questions answered  , MD 11/01/2020, 2:41 PM

## 2020-11-01 NOTE — Anesthesia Procedure Notes (Signed)
Procedure Name: Intubation Date/Time: 11/01/2020 3:20 PM Performed by: Rande Brunt, CRNA Pre-anesthesia Checklist: Patient identified, Emergency Drugs available, Suction available and Patient being monitored Patient Re-evaluated:Patient Re-evaluated prior to induction Oxygen Delivery Method: Circle System Utilized Preoxygenation: Pre-oxygenation with 100% oxygen Induction Type: IV induction Ventilation: Mask ventilation without difficulty Laryngoscope Size: Mac and 4 Grade View: Grade II Tube type: Oral Tube size: 7.5 mm Number of attempts: 1 Airway Equipment and Method: Stylet and Oral airway Placement Confirmation: ETT inserted through vocal cords under direct vision,  positive ETCO2 and breath sounds checked- equal and bilateral Secured at: 23 cm Tube secured with: Tape Dental Injury: Teeth and Oropharynx as per pre-operative assessment

## 2020-11-02 NOTE — Op Note (Signed)
NAMEDINNIS, ROG MEDICAL RECORD [id number] ACCOUNT [id number] DATE OF BIRTH:Feb 25, 1986 FACILITY: [facility] LOCATION: [facility] PHYSICIAN:Epifania Littrell Diamantina Providence, MD  OPERATIVE REPORT  DATE OF PROCEDURE:  11/01/2020  PREOPERATIVE DIAGNOSIS:  Left comminuted displaced clavicle fracture.  POSTOPERATIVE DIAGNOSIS:  Left comminuted displaced clavicle fracture.  PROCEDURE:  Open reduction internal fixation of left comminuted displaced clavicle fracture using Arthrex plates and one suture cable from Arthrex.  SURGEON:  Cammy Copa, MD  ASSISTANT:  Karenann Cai, PA  INDICATIONS:  The patient is a 34 year old patient with left clavicle fracture sustained while playing hockey, presents for operative management of fracture after explanation of risks and benefits.  PROCEDURE IN DETAIL:  The patient was brought to the operating room where general anesthetic was induced.  Preoperative antibiotics administered.  Timeout was called.  The patient was placed on the _____ bed and the head was elevated about 30 degrees.   Foot plate was present.  Left shoulder, arm and hand prescrubbed with alcohol and Betadine, allowed to air dry, prepped with DuraPrep solution and draped in a sterile manner.  Ioban used to cover the operative field.  Timeout was called.  Incision made  over the clavicle.  Skin and subcutaneous tissue were sharply divided.  Crossing sensory nerves were preserved when possible.  The fracture site was exposed.  Four comminuted fragments were present.  The fracture was irrigated and fracture reads were  obtained from the major fracture fragments.  The lateral portion of the fracture was split in the coronal plane.  This was reduced and held with 2 FiberTape suture, 1.3 mm.  Next, comminuted piece on the dorsal aspect was Muff reincorporated into the 2  main fragments medially and laterally.  These were distracted and the length of the clavicle was restored.  At this time, an Arthrex suture cable  was placed around that fracture fragment dorsally to hold it into position.  Soft tissue attachments were  maintained when possible.  Next, an Arthrex plate was applied under fluoroscopic guidance.  Four bicortical screws placed in the medial fragment, 4 locking and 1 bicortical screw placed in the lateral fragment.  This combined with 3 cables fixation gave  good fixation of the fracture.  The incision was Nobel thoroughly irrigated.  The fascia was Michelini closed over the plate.  That was done using #1 Vicryl sutures followed by interrupted inverted 0 Vicryl suture, 2-0 Vicryl suture and 3-0 Monocryl.   Vancomycin powder placed on top of the plate as well.  Aquacel dressing and shoulder immobilizer placed.  The patient tolerated the procedure well without immediate complication.  Luke's assistance was required at all times during the case for opening  and closing, mobilization of tissue.  His assistance was a medical necessity.  HN/NUANCE  D:11/01/2020 T:11/01/2020 JOB:[id number]

## 2020-11-04 ENCOUNTER — Encounter (HOSPITAL_COMMUNITY): Payer: Self-pay | Admitting: Orthopedic Surgery

## 2020-11-06 DIAGNOSIS — S42022A Displaced fracture of shaft of left clavicle, initial encounter for closed fracture: Secondary | ICD-10-CM

## 2020-11-09 ENCOUNTER — Ambulatory Visit (INDEPENDENT_AMBULATORY_CARE_PROVIDER_SITE_OTHER): Payer: 59

## 2020-11-09 ENCOUNTER — Ambulatory Visit (INDEPENDENT_AMBULATORY_CARE_PROVIDER_SITE_OTHER): Payer: 59 | Admitting: Orthopedic Surgery

## 2020-11-09 DIAGNOSIS — S42022A Displaced fracture of shaft of left clavicle, initial encounter for closed fracture: Secondary | ICD-10-CM

## 2020-11-13 ENCOUNTER — Encounter: Payer: Self-pay | Admitting: Orthopedic Surgery

## 2020-11-13 NOTE — Progress Notes (Signed)
   Post-Op Visit Note   Patient: Stuart Wade           Date of Birth: 1985-09-30           MRN: [id number] Visit Date: 11/09/2020 PCP: Joycelyn Rua, MD   Assessment & Plan:  Chief Complaint:  Chief Complaint  Patient presents with  . Other    11/01/20 left clavicle ORIF   Visit Diagnoses:  1. Closed displaced fracture of shaft of left clavicle, initial encounter     Plan: Terell is a patient is now about a week out left clavicle fracture fixation.  Is been doing well.  On exam incision is intact.  Radiographs look good.  I will keep him in the sling for 2 more weeks.  We will start him on some pendulum exercises [place].  He is back at work.  Anticipate at least 3 to 4 months for him to return to full activity.  He does have a very muscular arm and shoulder and thus this clavicle will need to be completely and fully healed before he starts stressing it.  Recheck x-rays in 2 weeks as well before we start doing pendulums.  He did stop pain medicine several days after surgery and is currently only taking ibuprofen as needed Follow-Up Instructions: Return in about 2 weeks (around 11/23/2020).   Orders:  Orders Placed This Encounter  Procedures  . XR Clavicle Left   No orders of the defined types were placed in this encounter.   Imaging: No results found.  PMFS History: Patient Active Problem List   Diagnosis Date Noted  . Displaced fracture of shaft of left clavicle, initial encounter for closed fracture   . Left hand pain 12/27/2017   Past Medical History:  Diagnosis Date  . Anxiety   . Depression     No family history on file.  Past Surgical History:  Procedure Laterality Date  . ADENOIDECTOMY    . ORIF CLAVICULAR FRACTURE Left 11/01/2020   Procedure: LEFT OPEN REDUCTION INTERNAL FIXATION (ORIF) CLAVICULAR FRACTURE;  Surgeon: Cammy Copa, MD;  Location: [facility] OR;  Service: Orthopedics;  Laterality: Left;  . TEAR DUCT PROBING    . TONSILLECTOMY    . WISDOM TOOTH  EXTRACTION     Social History   Occupational History  . Not on file  Tobacco Use  . Smoking status: Never Smoker  . Smokeless tobacco: Never Used  Vaping Use  . Vaping Use: Never used  Substance and Sexual Activity  . Alcohol use: Yes    Comment: occ  . Drug use: Never  . Sexual activity: Not on file

## 2020-11-24 ENCOUNTER — Ambulatory Visit (INDEPENDENT_AMBULATORY_CARE_PROVIDER_SITE_OTHER): Payer: 59

## 2020-11-24 ENCOUNTER — Other Ambulatory Visit: Payer: Self-pay

## 2020-11-24 ENCOUNTER — Ambulatory Visit (INDEPENDENT_AMBULATORY_CARE_PROVIDER_SITE_OTHER): Payer: 59 | Admitting: Orthopedic Surgery

## 2020-11-24 DIAGNOSIS — Z9889 Other specified postprocedural states: Secondary | ICD-10-CM | POA: Diagnosis not present

## 2020-11-24 DIAGNOSIS — Z8781 Personal history of (healed) traumatic fracture: Secondary | ICD-10-CM | POA: Diagnosis not present

## 2020-11-25 ENCOUNTER — Encounter: Payer: Self-pay | Admitting: Orthopedic Surgery

## 2020-11-25 NOTE — Progress Notes (Signed)
Post-Op Visit Note   Patient: Stuart Wade           Date of Birth: May 04, 1986           MRN: [id number] Visit Date: 11/24/2020 PCP: Joycelyn Rua, MD   Assessment & Plan:  Chief Complaint:  Chief Complaint  Patient presents with  . Left Shoulder - Routine Post Op   Visit Diagnoses:  1. S/P ORIF (open reduction internal fixation) fracture     Plan: Patient is a 35 year old male who presents s/p left clavicle fracture ORIF on 11/01/2020.  He reports he is doing well and feels much better and regards to his pain control.  He is ambulating with his arm in a sling.  He takes occasional ibuprofen for pain control without that.  He is currently working from home and works for [employer].  Incision is healing well with one small scab noted to the mid lateral portion of the incision.  The scab was unroofed to reveal a small tail of Monocryl suture with very mild superficial breakdown of the incision.  No sign of an infection with no drainage, probing to the deeper tissues, or surrounding cellulitis.  This suture was pulled taut with tension and cut at the base with suture scissors and no further suture was observed.  Recommended that patient contact the office if he notices any concerns regarding his incision in the coming days and weeks.  On exam patient has 45 degrees external rotation, 100 degrees abduction, 130 degrees forward flexion.  Radiographs taken today of the left clavicle show no change in position of the hardware whatsoever but he does have continued fracture line.  Thus patient is not ready for full activity yet.  He may discontinue the sling but no active motion of the arm above shoulder level.  Recommend he just keep the shoulder by his side and perform pendulum exercises through out the day.  Encouraged him to wear his sling when he is out and about.  He may also take some vitamin D supplementation in the form of 2000 or 5000 units a day which may assist with  overall healing of this fracture.  Patient agreed with this plan and agreed to abide by his restrictions.  Plan to follow-up in 3 weeks for clinical recheck.  New radiographs will be taken at that time.  If those show good sign of fracture healing, plan to initiate full active motion of the left shoulder at that time.  Follow-Up Instructions: No follow-ups on file.   Orders:  Orders Placed This Encounter  Procedures  . XR Clavicle Left   No orders of the defined types were placed in this encounter.   Imaging: No results found.  PMFS History: Patient Active Problem List   Diagnosis Date Noted  . Displaced fracture of shaft of left clavicle, initial encounter for closed fracture   . Left hand pain 12/27/2017   Past Medical History:  Diagnosis Date  . Anxiety   . Depression     No family history on file.  Past Surgical History:  Procedure Laterality Date  . ADENOIDECTOMY    . ORIF CLAVICULAR FRACTURE Left 11/01/2020   Procedure: LEFT OPEN REDUCTION INTERNAL FIXATION (ORIF) CLAVICULAR FRACTURE;  Surgeon: Cammy Copa, MD;  Location: [facility] OR;  Service: Orthopedics;  Laterality: Left;  . TEAR DUCT PROBING    . TONSILLECTOMY    . WISDOM TOOTH EXTRACTION     Social History   Occupational History  .  Not on file  Tobacco Use  . Smoking status: Never Smoker  . Smokeless tobacco: Never Used  Vaping Use  . Vaping Use: Never used  Substance and Sexual Activity  . Alcohol use: Yes    Comment: occ  . Drug use: Never  . Sexual activity: Not on file

## 2020-11-26 ENCOUNTER — Encounter: Payer: Self-pay | Admitting: Orthopedic Surgery

## 2020-12-15 ENCOUNTER — Ambulatory Visit (INDEPENDENT_AMBULATORY_CARE_PROVIDER_SITE_OTHER): Payer: 59

## 2020-12-15 ENCOUNTER — Ambulatory Visit (INDEPENDENT_AMBULATORY_CARE_PROVIDER_SITE_OTHER): Payer: 59 | Admitting: Orthopedic Surgery

## 2020-12-15 DIAGNOSIS — Z8781 Personal history of (healed) traumatic fracture: Secondary | ICD-10-CM | POA: Diagnosis not present

## 2020-12-15 DIAGNOSIS — Z9889 Other specified postprocedural states: Secondary | ICD-10-CM | POA: Diagnosis not present

## 2020-12-18 ENCOUNTER — Encounter: Payer: Self-pay | Admitting: Orthopedic Surgery

## 2020-12-18 NOTE — Progress Notes (Signed)
   Post-Op Visit Note   Patient: Stuart Wade           Date of Birth: 01-08-1986           MRN: [id number] Visit Date: 12/15/2020 PCP: Joycelyn Rua, MD   Assessment & Plan:  Chief Complaint:  Chief Complaint  Patient presents with  . Left Shoulder - Follow-up   Visit Diagnoses:  1. S/P ORIF (open reduction internal fixation) fracture     Plan: [name] is a 35 year old patient who is now 6 weeks out left clavicle fracture fixation.  Has been doing well.  On exam he has excellent range of motion of the left shoulder with no pain at the fracture site radiographs also look good.  Plan at this time is to let him start doing some activities of daily living but no lifting with that left arm more than 2 or 3 pounds.  I think it is okay for him to do some ice skating and stick work but no real return to play yet.  Would like to see him back in 4 weeks with final set of x-rays and likely release to activity at that time.  Essentially want to let this bone get fully healed before we llet him start doing some weightlifting.  Follow-Up Instructions: Return in about 4 weeks (around 01/12/2021).   Orders:  Orders Placed This Encounter  Procedures  . XR Clavicle Left   No orders of the defined types were placed in this encounter.   Imaging: No results found.  PMFS History: Patient Active Problem List   Diagnosis Date Noted  . Displaced fracture of shaft of left clavicle, initial encounter for closed fracture   . Left hand pain 12/27/2017   Past Medical History:  Diagnosis Date  . Anxiety   . Depression     History reviewed. No pertinent family history.  Past Surgical History:  Procedure Laterality Date  . ADENOIDECTOMY    . ORIF CLAVICULAR FRACTURE Left 11/01/2020   Procedure: LEFT OPEN REDUCTION INTERNAL FIXATION (ORIF) CLAVICULAR FRACTURE;  Surgeon: Cammy Copa, MD;  Location: [facility] OR;  Service: Orthopedics;  Laterality: Left;  . TEAR DUCT PROBING    . TONSILLECTOMY    .  WISDOM TOOTH EXTRACTION     Social History   Occupational History  . Not on file  Tobacco Use  . Smoking status: Never Smoker  . Smokeless tobacco: Never Used  Vaping Use  . Vaping Use: Never used  Substance and Sexual Activity  . Alcohol use: Yes    Comment: occ  . Drug use: Never  . Sexual activity: Not on file

## 2020-12-26 DIAGNOSIS — G479 Sleep disorder, unspecified: Secondary | ICD-10-CM | POA: Diagnosis not present

## 2020-12-26 DIAGNOSIS — Z0000 Encounter for general adult medical examination without abnormal findings: Secondary | ICD-10-CM | POA: Diagnosis not present

## 2021-01-13 ENCOUNTER — Ambulatory Visit: Payer: 59 | Admitting: Orthopedic Surgery

## 2021-01-16 ENCOUNTER — Ambulatory Visit (INDEPENDENT_AMBULATORY_CARE_PROVIDER_SITE_OTHER): Payer: 59 | Admitting: Orthopedic Surgery

## 2021-01-16 ENCOUNTER — Ambulatory Visit (INDEPENDENT_AMBULATORY_CARE_PROVIDER_SITE_OTHER): Payer: 59

## 2021-01-16 DIAGNOSIS — Z8781 Personal history of (healed) traumatic fracture: Secondary | ICD-10-CM

## 2021-01-16 DIAGNOSIS — Z9889 Other specified postprocedural states: Secondary | ICD-10-CM | POA: Diagnosis not present

## 2021-01-18 ENCOUNTER — Encounter: Payer: Self-pay | Admitting: Orthopedic Surgery

## 2021-01-18 NOTE — Progress Notes (Signed)
   Post-Op Visit Note   Patient: Stuart Wade           Date of Birth: [birth year]           MRN: [id number] Visit Date: 01/16/2021 PCP: Joycelyn Rua, MD   Assessment & Plan:  Chief Complaint:  Chief Complaint  Patient presents with  . Other     Follow up left clavicle fixation   Visit Diagnoses:  1. S/P ORIF (open reduction internal fixation) fracture     Plan: Patient is a 35 year old male who presents s/p left clavicle ORIF on 11/01/2020.  He is doing very well and reports no pain at all.  He has very minimal "stiffness" and discomfort with lifting the arm into full forward flexion but this seems to be steadily improving.  He does have some incisional numbness around the incision site but this does not bother him.  He has been doing a home exercise program focusing on achieving full range of motion.  He does have excellent range of motion of the left shoulder today with no tenderness over the fracture site.  He did play a no contact hockey game last night and reports he had no pain with using a hockey stick.  Plan for patient to ease him self back into full activity and cautioned him particularly against any heavy press exercises such as bench press, dumbbell press, military press.  Radiographs were reviewed with patient today and show excellent consolidation at the fracture site with no compromise of the hardware.  The incision is healed well with no evidence of infection or dehiscence.  Overall he is doing very well and plan to have him follow-up as needed.  Follow-Up Instructions: No follow-ups on file.   Orders:  Orders Placed This Encounter  Procedures  . XR Clavicle Left   No orders of the defined types were placed in this encounter.   Imaging: No results found.  PMFS History: Patient Active Problem List   Diagnosis Date Noted  . Displaced fracture of shaft of left clavicle, initial encounter for closed fracture   . Left hand pain 12/27/2017   Past Medical History:   Diagnosis Date  . Anxiety   . Depression     No family history on file.  Past Surgical History:  Procedure Laterality Date  . ADENOIDECTOMY    . ORIF CLAVICULAR FRACTURE Left 11/01/2020   Procedure: LEFT OPEN REDUCTION INTERNAL FIXATION (ORIF) CLAVICULAR FRACTURE;  Surgeon: Cammy Copa, MD;  Location: [facility] OR;  Service: Orthopedics;  Laterality: Left;  . TEAR DUCT PROBING    . TONSILLECTOMY    . WISDOM TOOTH EXTRACTION     Social History   Occupational History  . Not on file  Tobacco Use  . Smoking status: Never Smoker  . Smokeless tobacco: Never Used  Vaping Use  . Vaping Use: Never used  Substance and Sexual Activity  . Alcohol use: Yes    Comment: occ  . Drug use: Never  . Sexual activity: Not on file

## 2021-01-21 ENCOUNTER — Encounter: Payer: Self-pay | Admitting: Orthopedic Surgery

## 2021-02-07 DIAGNOSIS — J343 Hypertrophy of nasal turbinates: Secondary | ICD-10-CM | POA: Insufficient documentation

## 2021-02-07 DIAGNOSIS — J342 Deviated nasal septum: Secondary | ICD-10-CM | POA: Insufficient documentation

## 2022-02-03 IMAGING — RF DG CLAVICLE*L*
1 series · 1 of 1 positions shown · non-contrast
Comparison: Left shoulder radiographs dated 10/26/2020.

CLINICAL DATA: Left clavicle fracture status post fixation.

EXAM:
LEFT CLAVICLE - 2+ VIEWS

[Series 1: run · 1 of 1 slices shown]
[im 1/1]
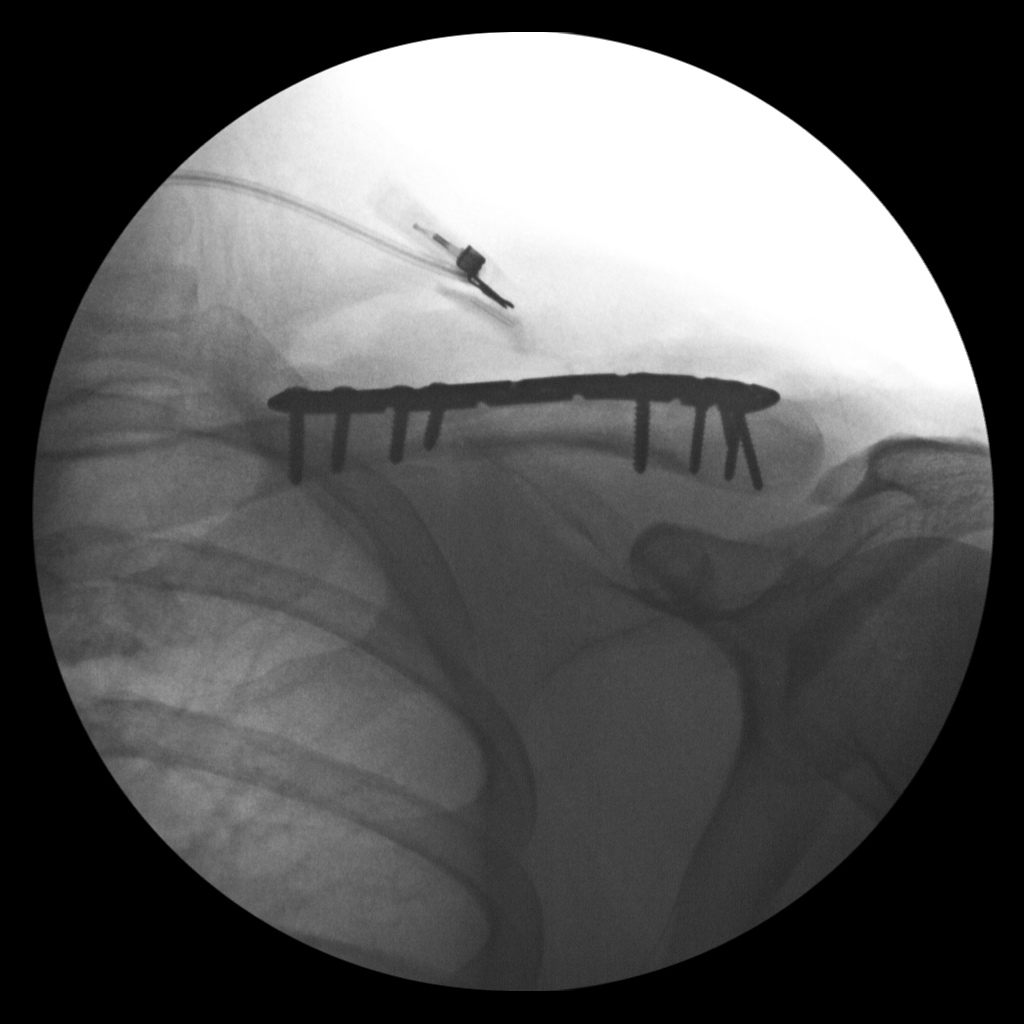

[1 of 1 positions shown; findings below may reference images not displayed]

FINDINGS: Intraoperative fluoroscopy image demonstrates open reduction
internal fixation with plate and screws for a segmental left
clavicle fracture which is now in near anatomic alignment.
IMPRESSION: Intraoperative images demonstrate fixation of a clavicle fracture.

## 2022-02-03 IMAGING — RF DG C-ARM 1-60 MIN
1 series · 1 of 1 positions shown · IV contrast (agent unspecified)
Comparison: Left shoulder radiographs dated 10/26/2020.

CLINICAL DATA: Left clavicle fractures status post fixation.

EXAM:
DG C-ARM 1-60 MIN
CONTRAST:  None
FLUOROSCOPY TIME:  Fluoroscopy Time:  9 seconds
Radiation Exposure Index (if provided by the fluoroscopic device):
0.78 mGy
Number of Acquired Spot Images: 1

[Series 1: run · 1 of 1 slices shown]
[im 1/1]
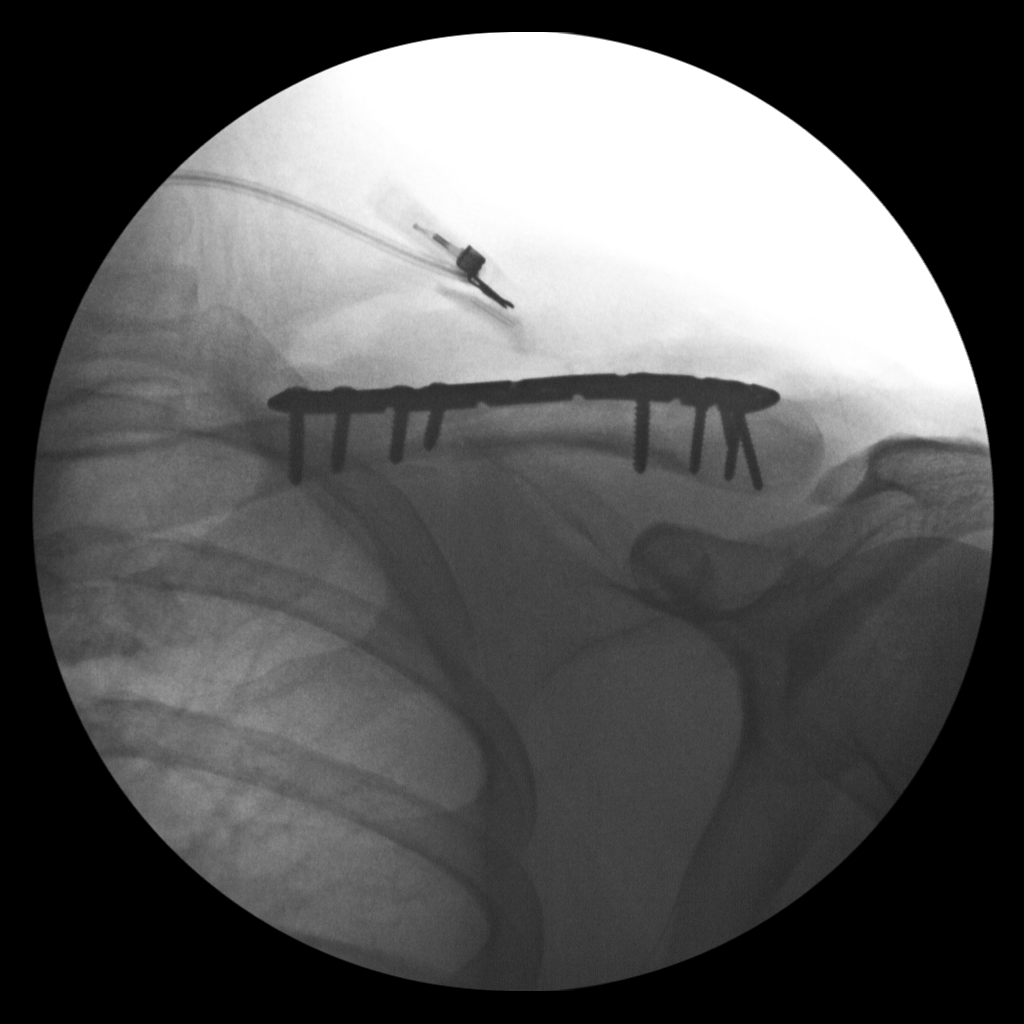

[1 of 1 positions shown; findings below may reference images not displayed]

FINDINGS: Intraoperative fluoroscopy image demonstrates open reduction
internal fixation with plate and screws for a segmental left
clavicle fracture which is now in near anatomic alignment.
IMPRESSION: Intraoperative images demonstrate fixation of a clavicle fracture.

## 2022-03-12 DIAGNOSIS — F4320 Adjustment disorder, unspecified: Secondary | ICD-10-CM | POA: Diagnosis not present

## 2022-03-22 DIAGNOSIS — F4322 Adjustment disorder with anxiety: Secondary | ICD-10-CM | POA: Diagnosis not present

## 2022-04-05 DIAGNOSIS — F4322 Adjustment disorder with anxiety: Secondary | ICD-10-CM | POA: Diagnosis not present

## 2022-04-13 DIAGNOSIS — F4322 Adjustment disorder with anxiety: Secondary | ICD-10-CM | POA: Diagnosis not present

## 2022-04-17 DIAGNOSIS — F4322 Adjustment disorder with anxiety: Secondary | ICD-10-CM | POA: Diagnosis not present

## 2022-05-08 DIAGNOSIS — F411 Generalized anxiety disorder: Secondary | ICD-10-CM | POA: Diagnosis not present

## 2022-05-09 DIAGNOSIS — H52223 Regular astigmatism, bilateral: Secondary | ICD-10-CM | POA: Diagnosis not present

## 2022-05-14 DIAGNOSIS — Z0000 Encounter for general adult medical examination without abnormal findings: Secondary | ICD-10-CM | POA: Diagnosis not present

## 2022-05-14 DIAGNOSIS — Z13220 Encounter for screening for lipoid disorders: Secondary | ICD-10-CM | POA: Diagnosis not present

## 2022-05-15 DIAGNOSIS — F411 Generalized anxiety disorder: Secondary | ICD-10-CM | POA: Diagnosis not present

## 2022-05-22 DIAGNOSIS — F411 Generalized anxiety disorder: Secondary | ICD-10-CM | POA: Diagnosis not present

## 2022-05-29 DIAGNOSIS — F411 Generalized anxiety disorder: Secondary | ICD-10-CM | POA: Diagnosis not present

## 2022-06-05 DIAGNOSIS — F411 Generalized anxiety disorder: Secondary | ICD-10-CM | POA: Diagnosis not present

## 2022-06-12 DIAGNOSIS — F411 Generalized anxiety disorder: Secondary | ICD-10-CM | POA: Diagnosis not present

## 2022-06-19 DIAGNOSIS — F411 Generalized anxiety disorder: Secondary | ICD-10-CM | POA: Diagnosis not present

## 2022-07-10 DIAGNOSIS — F411 Generalized anxiety disorder: Secondary | ICD-10-CM | POA: Diagnosis not present

## 2022-07-17 DIAGNOSIS — F411 Generalized anxiety disorder: Secondary | ICD-10-CM | POA: Diagnosis not present

## 2022-07-24 DIAGNOSIS — F411 Generalized anxiety disorder: Secondary | ICD-10-CM | POA: Diagnosis not present

## 2022-07-31 DIAGNOSIS — F411 Generalized anxiety disorder: Secondary | ICD-10-CM | POA: Diagnosis not present

## 2022-08-07 DIAGNOSIS — F411 Generalized anxiety disorder: Secondary | ICD-10-CM | POA: Diagnosis not present

## 2022-08-14 DIAGNOSIS — F411 Generalized anxiety disorder: Secondary | ICD-10-CM | POA: Diagnosis not present

## 2022-08-21 DIAGNOSIS — F411 Generalized anxiety disorder: Secondary | ICD-10-CM | POA: Diagnosis not present

## 2022-09-13 DIAGNOSIS — F411 Generalized anxiety disorder: Secondary | ICD-10-CM | POA: Diagnosis not present

## 2022-09-18 DIAGNOSIS — F411 Generalized anxiety disorder: Secondary | ICD-10-CM | POA: Diagnosis not present

## 2022-10-02 DIAGNOSIS — F411 Generalized anxiety disorder: Secondary | ICD-10-CM | POA: Diagnosis not present

## 2022-10-09 DIAGNOSIS — F411 Generalized anxiety disorder: Secondary | ICD-10-CM | POA: Diagnosis not present

## 2022-10-16 DIAGNOSIS — F411 Generalized anxiety disorder: Secondary | ICD-10-CM | POA: Diagnosis not present

## 2022-11-02 DIAGNOSIS — F411 Generalized anxiety disorder: Secondary | ICD-10-CM | POA: Diagnosis not present

## 2023-05-16 DIAGNOSIS — B070 Plantar wart: Secondary | ICD-10-CM | POA: Diagnosis not present

## 2023-05-16 DIAGNOSIS — Z0000 Encounter for general adult medical examination without abnormal findings: Secondary | ICD-10-CM | POA: Diagnosis not present

## 2023-12-18 ENCOUNTER — Ambulatory Visit: Payer: PRIVATE HEALTH INSURANCE | Admitting: Orthopedic Surgery

## 2024-01-17 ENCOUNTER — Ambulatory Visit (HOSPITAL_BASED_OUTPATIENT_CLINIC_OR_DEPARTMENT_OTHER)

## 2024-01-17 ENCOUNTER — Ambulatory Visit (HOSPITAL_BASED_OUTPATIENT_CLINIC_OR_DEPARTMENT_OTHER): Admitting: Orthopaedic Surgery

## 2024-01-17 DIAGNOSIS — M25552 Pain in left hip: Secondary | ICD-10-CM | POA: Diagnosis not present

## 2024-01-17 NOTE — Progress Notes (Signed)
 Chief Complaint: Left hip pain     History of Present Illness:    Stuart Wade is a 38 y.o. male presents today with ongoing left hip and groin pain.  He has had 5+ years of this.  He has extensively tried physical therapy in the past without any relief.  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  He states that the hips are persistently stiff and are painful in the butterfly position.  Particularly internal rotation is what causes him the most pain and irritation   PMH/PSH/Family History/Social History/Meds/Allergies:    Past Medical History:  Diagnosis Date   Anxiety    Depression    Past Surgical History:  Procedure Laterality Date   ADENOIDECTOMY     ORIF CLAVICULAR FRACTURE Left 11/01/2020   Procedure: LEFT OPEN REDUCTION INTERNAL FIXATION (ORIF) CLAVICULAR FRACTURE;  Surgeon: Jasmine Mesi, MD;  Location: [facility] OR;  Service: Orthopedics;  Laterality: Left;   TEAR DUCT PROBING     TONSILLECTOMY     WISDOM TOOTH EXTRACTION     Social History   Socioeconomic History   Marital status: Married    Spouse name: Not on file   Number of children: Not on file   Years of education: Not on file   Highest education level: Not on file  Occupational History   Not on file  Tobacco Use   Smoking status: Never   Smokeless tobacco: Never  Vaping Use   Vaping status: Never Used  Substance and Sexual Activity   Alcohol use: Yes    Comment: occ   Drug use: Never   Sexual activity: Not on file  Other Topics Concern   Not on file  Social History Narrative   Not on file   Social Drivers of Health   Financial Resource Strain: Not on file  Food Insecurity: Not on file  Transportation Needs: Not on file  Physical Activity: Not on file  Stress: Not on file  Social Connections: Unknown (01/19/2022)   [facility]   Social Network    Social Network: Not on file   No family history on file. No Known Allergies Current Outpatient Medications  Medication  Sig Dispense Refill   b complex vitamins capsule Take 1 capsule by mouth daily.     ketorolac  (TORADOL ) 10 MG tablet Take 1 tablet (10 mg total) by mouth every 8 (eight) hours as needed. 15 tablet 0   methocarbamol  (ROBAXIN ) 500 MG tablet Take 1 tablet (500 mg total) by mouth every 8 (eight) hours as needed. 30 tablet 0   No current facility-administered medications for this visit.   No results found.  Review of Systems:   A ROS was performed including pertinent positives and negatives as documented in the HPI.  Physical Exam :   Constitutional: NAD and appears stated age Neurological: Alert and oriented Psych: Appropriate affect and cooperative There were no vitals taken for this visit.   Comprehensive Musculoskeletal Exam:    Left hip tenderness about the femoral acetabular joint with pain with 10 degrees internal rotation.  30 degrees of external rotation improves his pain particularly 90 degrees of flexion.  Distal neurosensory exam is intact.  Excellent abductor strength   Imaging:   Xray (4 views left hip): Significant cam lesion without evidence of femoral acetabular osteoarthritis    I personally reviewed and interpreted the radiographs.   Assessment and Plan:   38 y.o. male with evidence of femoral acetabular impingement  of the left hip.  At this time he has trialed activity restriction as well as physical therapy without any relief.  Given this I would like to obtain an MRI of the left hip and see him back to discuss results  -Plan for MRI left hip and follow-up to discuss results   I personally saw and evaluated the patient, and participated in the management and treatment plan.  Wilhelmenia Harada, MD Attending Physician, Orthopedic Surgery  This document was dictated using Dragon voice recognition software. A reasonable attempt at proof reading has been made to minimize errors.

## 2024-01-21 ENCOUNTER — Encounter (HOSPITAL_BASED_OUTPATIENT_CLINIC_OR_DEPARTMENT_OTHER): Payer: Self-pay | Admitting: Orthopaedic Surgery

## 2024-01-29 ENCOUNTER — Other Ambulatory Visit

## 2024-01-29 ENCOUNTER — Ambulatory Visit
Admission: RE | Admit: 2024-01-29 | Discharge: 2024-01-29 | Disposition: A | Discharge status: Home / Self Care | Source: Ambulatory Visit | Attending: Orthopaedic Surgery | Admitting: Orthopaedic Surgery

## 2024-01-29 DIAGNOSIS — M25552 Pain in left hip: Secondary | ICD-10-CM

## 2024-01-29 DIAGNOSIS — G8929 Other chronic pain: Secondary | ICD-10-CM | POA: Diagnosis not present

## 2024-02-13 ENCOUNTER — Ambulatory Visit: Payer: Self-pay | Admitting: Orthopaedic Surgery

## 2024-02-13 ENCOUNTER — Ambulatory Visit (HOSPITAL_BASED_OUTPATIENT_CLINIC_OR_DEPARTMENT_OTHER): Admitting: Orthopaedic Surgery

## 2024-02-13 ENCOUNTER — Other Ambulatory Visit (HOSPITAL_BASED_OUTPATIENT_CLINIC_OR_DEPARTMENT_OTHER): Payer: Self-pay

## 2024-02-13 DIAGNOSIS — S73192A Other sprain of left hip, initial encounter: Secondary | ICD-10-CM

## 2024-02-13 MED ORDER — ACETAMINOPHEN 500 MG PO TABS
500.0000 mg | ORAL_TABLET | Freq: Three times a day (TID) | ORAL | 0 refills | Status: AC
  Filled 2024-02-13: qty 30, 10d supply, fill #0

## 2024-02-13 MED ORDER — IBUPROFEN 800 MG PO TABS
800.0000 mg | ORAL_TABLET | Freq: Three times a day (TID) | ORAL | 0 refills | Status: AC
  Filled 2024-02-13: qty 30, 10d supply, fill #0

## 2024-02-13 MED ORDER — ASPIRIN 325 MG PO TBEC
325.0000 mg | DELAYED_RELEASE_TABLET | Freq: Every day | ORAL | 0 refills | Status: AC
  Filled 2024-02-13: qty 14, 14d supply, fill #0

## 2024-02-13 MED ORDER — OXYCODONE HCL 5 MG PO TABS
5.0000 mg | ORAL_TABLET | ORAL | 0 refills | Status: AC | PRN
  Filled 2024-02-13: qty 10, 2d supply, fill #0

## 2024-02-13 NOTE — Progress Notes (Addendum)
 Chief Complaint: Left hip pain     History of Present Illness:   02/13/2024: Presents today for MRI follow-up.  Stuart Wade is a 38 y.o. male presents today with ongoing left hip and groin pain.  He has had 5+ years of this.  He has extensively tried physical therapy in the past without any relief.  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  He states that the hips are persistently stiff and are painful in the butterfly position.  Particularly internal rotation is what causes him the most pain and irritation   PMH/PSH/Family History/Social History/Meds/Allergies:    Past Medical History:  Diagnosis Date   Anxiety    Depression    Past Surgical History:  Procedure Laterality Date   ADENOIDECTOMY     ORIF CLAVICULAR FRACTURE Left 11/01/2020   Procedure: LEFT OPEN REDUCTION INTERNAL FIXATION (ORIF) CLAVICULAR FRACTURE;  Surgeon: Addie Cordella Hamilton, MD;  Location: [facility] OR;  Service: Orthopedics;  Laterality: Left;   TEAR DUCT PROBING     TONSILLECTOMY     WISDOM TOOTH EXTRACTION     Social History   Socioeconomic History   Marital status: Married    Spouse name: Not on file   Number of children: Not on file   Years of education: Not on file   Highest education level: Not on file  Occupational History   Not on file  Tobacco Use   Smoking status: Never   Smokeless tobacco: Never  Vaping Use   Vaping status: Never Used  Substance and Sexual Activity   Alcohol use: Yes    Comment: occ   Drug use: Never   Sexual activity: Not on file  Other Topics Concern   Not on file  Social History Narrative   Not on file   Social Drivers of Health   Financial Resource Strain: Not on file  Food Insecurity: Not on file  Transportation Needs: Not on file  Physical Activity: Not on file  Stress: Not on file  Social Connections: Unknown (01/19/2022)   Received from [facility]   Social Network    Social Network: Not on file   No family history on file. No Known  Allergies Current Outpatient Medications  Medication Sig Dispense Refill   acetaminophen  (TYLENOL ) 500 MG tablet Take 1 tablet (500 mg total) by mouth every 8 (eight) hours for 10 days. 30 tablet 0   aspirin  EC 325 MG tablet Take 1 tablet (325 mg total) by mouth daily. 14 tablet 0   ibuprofen  (ADVIL ) 800 MG tablet Take 1 tablet (800 mg total) by mouth every 8 (eight) hours for 10 days. Please take with food, please alternate with acetaminophen  30 tablet 0   oxyCODONE  (ROXICODONE ) 5 MG immediate release tablet Take 1 tablet (5 mg total) by mouth every 4 (four) hours as needed for severe pain (pain score 7-10) or breakthrough pain. 10 tablet 0   b complex vitamins capsule Take 1 capsule by mouth daily.     ketorolac  (TORADOL ) 10 MG tablet Take 1 tablet (10 mg total) by mouth every 8 (eight) hours as needed. 15 tablet 0   methocarbamol  (ROBAXIN ) 500 MG tablet Take 1 tablet (500 mg total) by mouth every 8 (eight) hours as needed. 30 tablet 0   No current facility-administered medications for this visit.   No results found.  Review of Systems:   A ROS was performed including pertinent positives and negatives as documented in the HPI.  Physical Exam :  Constitutional: NAD and appears stated age Neurological: Alert and oriented Psych: Appropriate affect and cooperative There were no vitals taken for this visit.   Comprehensive Musculoskeletal Exam:    Left hip tenderness about the femoral acetabular joint with pain with 10 degrees internal rotation.  30 degrees of external rotation improves his pain particularly 90 degrees of flexion.  Distal neurosensory exam is intact.  Excellent abductor strength   Imaging:   Xray (4 views left hip): Normal with 75% acetabular coverage  MRI left hip: Anterior superior labral tear with 60 degree alpha angle.  I personally reviewed and interpreted the radiographs.   Assessment and Plan:   38 y.o. male with left hip anterior superior labral tearing.   At this time he has tried physical therapy and strengthening.  He does have mechanical symptoms including popping and catching of the left hip.  His injury occurred traumatically 5 years prior when going into the butterfly position and being landed on. 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  At this time he is having persistent groin pain which is limiting his ability to stay active.  He has trialed a good gluteal strengthening program without any changes.  Given this we did discuss the possibility of left hip arthroscopy with labral repair.  I did discuss the limitations and restrictions associated with this.  I did discuss the associated recovery timeframe. His labral tear repair is an adjunct to an approved femoro-acetabular impingement (FAI) hip repair.  - Plan for left hip arthroscopy with labral repair   After a lengthy discussion of treatment options, including risks, benefits, alternatives, complications of surgical and nonsurgical conservative options, the patient elected surgical repair.   The patient  is aware of the material risks  and complications including, but not limited to injury to adjacent structures, neurovascular injury, infection, numbness, bleeding, implant failure, thermal burns, stiffness, persistent pain, failure to heal, disease transmission from allograft, need for further surgery, dislocation, anesthetic risks, blood clots, risks of death,and others. The probabilities of surgical success and failure discussed with patient given their particular co-morbidities.The time and nature of expected rehabilitation and recovery was discussed.The patient's questions were all answered preoperatively.  No barriers to understanding were noted. I explained the natural history of the disease process and Rx rationale.  I explained to the patient what I considered to be reasonable  expectations given their personal situation.  The final treatment plan was arrived at through a shared patient decision making process model.    I personally saw and evaluated the patient, and participated in the management and treatment plan.  Elspeth Parker, MD Attending Physician, Orthopedic Surgery  This document was dictated using Dragon voice recognition software. A reasonable attempt at proof reading has been made to minimize errors.

## 2024-02-24 ENCOUNTER — Other Ambulatory Visit (HOSPITAL_BASED_OUTPATIENT_CLINIC_OR_DEPARTMENT_OTHER): Payer: Self-pay

## 2024-02-26 ENCOUNTER — Telehealth (HOSPITAL_BASED_OUTPATIENT_CLINIC_OR_DEPARTMENT_OTHER): Payer: Self-pay | Admitting: Orthopaedic Surgery

## 2024-02-26 NOTE — Telephone Encounter (Signed)
 Patient wants to know if his surgery has been approved. I transferred to April vm

## 2024-02-27 NOTE — Telephone Encounter (Signed)
 I do not have a sheet on this patient.

## 2024-02-27 NOTE — Telephone Encounter (Signed)
 Surgery sheet sent  Sorry!

## 2024-02-28 NOTE — Telephone Encounter (Signed)
 I left a message on the patient's voicemail to return my call to schedule surgery.

## 2024-03-04 ENCOUNTER — Other Ambulatory Visit (HOSPITAL_BASED_OUTPATIENT_CLINIC_OR_DEPARTMENT_OTHER): Payer: Self-pay | Admitting: Orthopaedic Surgery

## 2024-03-04 DIAGNOSIS — S73192A Other sprain of left hip, initial encounter: Secondary | ICD-10-CM

## 2024-03-16 ENCOUNTER — Other Ambulatory Visit: Payer: Self-pay

## 2024-03-16 ENCOUNTER — Encounter (HOSPITAL_BASED_OUTPATIENT_CLINIC_OR_DEPARTMENT_OTHER): Payer: Self-pay | Admitting: Orthopaedic Surgery

## 2024-03-19 DIAGNOSIS — S73192A Other sprain of left hip, initial encounter: Secondary | ICD-10-CM | POA: Diagnosis not present

## 2024-03-19 NOTE — Progress Notes (Signed)

## 2024-03-19 NOTE — Anesthesia Preprocedure Evaluation (Signed)
 Anesthesia Evaluation    Reviewed: Allergy & Precautions, Patient's Chart, lab work & pertinent test results  Airway        Dental   Pulmonary neg pulmonary ROS          Cardiovascular negative cardio ROS      Neuro/Psych  PSYCHIATRIC DISORDERS Anxiety Depression    negative neurological ROS     GI/Hepatic negative GI ROS, Neg liver ROS,,,  Endo/Other  BMI 29  Renal/GU negative Renal ROS  negative genitourinary   Musculoskeletal negative musculoskeletal ROS (+)    Abdominal   Peds  Hematology negative hematology ROS (+)   Anesthesia Other Findings   Reproductive/Obstetrics negative OB ROS                              Anesthesia Physical Anesthesia Plan  ASA: 1  Anesthesia Plan: General   Post-op Pain Management: Tylenol  PO (pre-op)*, Toradol  IV (intra-op)*, Dilaudid IV and Precedex    Induction: Intravenous  PONV Risk Score and Plan: 2 and Ondansetron , Dexamethasone , Midazolam  and Treatment may vary due to age or medical condition  Airway Management Planned: Oral ETT  Additional Equipment: None  Intra-op Plan:   Post-operative Plan: Extubation in OR  Informed Consent:   Plan Discussed with:   Anesthesia Plan Comments:         Anesthesia Quick Evaluation

## 2024-03-20 ENCOUNTER — Encounter (HOSPITAL_BASED_OUTPATIENT_CLINIC_OR_DEPARTMENT_OTHER): Payer: Self-pay | Admitting: Orthopaedic Surgery

## 2024-03-23 ENCOUNTER — Encounter (HOSPITAL_BASED_OUTPATIENT_CLINIC_OR_DEPARTMENT_OTHER): Payer: Self-pay | Admitting: Orthopaedic Surgery

## 2024-03-24 ENCOUNTER — Encounter (HOSPITAL_BASED_OUTPATIENT_CLINIC_OR_DEPARTMENT_OTHER): Payer: Self-pay | Admitting: Anesthesiology

## 2024-03-24 ENCOUNTER — Ambulatory Visit (HOSPITAL_BASED_OUTPATIENT_CLINIC_OR_DEPARTMENT_OTHER): Admission: RE | Admit: 2024-03-24 | Source: Home / Self Care | Admitting: Orthopaedic Surgery

## 2024-03-24 ENCOUNTER — Encounter (HOSPITAL_BASED_OUTPATIENT_CLINIC_OR_DEPARTMENT_OTHER): Admission: RE | Payer: Self-pay | Source: Home / Self Care

## 2024-03-24 SURGERY — ARTHROSCOPY, HIP, WITH LABRUM REPAIR
Anesthesia: General | Laterality: Left

## 2024-03-27 ENCOUNTER — Ambulatory Visit (HOSPITAL_BASED_OUTPATIENT_CLINIC_OR_DEPARTMENT_OTHER): Admitting: Physical Therapy

## 2024-03-30 ENCOUNTER — Other Ambulatory Visit (INDEPENDENT_AMBULATORY_CARE_PROVIDER_SITE_OTHER): Payer: Self-pay | Admitting: Orthopaedic Surgery

## 2024-03-30 DIAGNOSIS — S73192A Other sprain of left hip, initial encounter: Secondary | ICD-10-CM | POA: Diagnosis not present

## 2024-03-30 DIAGNOSIS — M25562 Pain in left knee: Secondary | ICD-10-CM

## 2024-03-30 MED ORDER — ASPIRIN 325 MG PO TBEC: 325.0000 mg | DELAYED_RELEASE_TABLET | Freq: Every day | ORAL | 0 refills | Status: AC

## 2024-03-30 MED ORDER — ACETAMINOPHEN 500 MG PO TABS: 500.0000 mg | ORAL_TABLET | Freq: Three times a day (TID) | ORAL | 0 refills | Status: AC

## 2024-03-30 MED ORDER — IBUPROFEN 800 MG PO TABS: 800.0000 mg | ORAL_TABLET | Freq: Three times a day (TID) | ORAL | 0 refills | Status: AC

## 2024-03-30 MED ORDER — OXYCODONE HCL 5 MG PO TABS: 5.0000 mg | ORAL_TABLET | ORAL | 0 refills | Status: AC | PRN

## 2024-03-30 NOTE — Progress Notes (Signed)
 Date of Surgery: 03/30/2024  INDICATIONS: Stuart Wade is a 38 y.o.-year-old male with left hip labral tear.  The risk and benefits of the procedure were discussed in detail and documented in the pre-operative evaluation.   PREOPERATIVE DIAGNOSIS: 1. Left hip labral tear  POSTOPERATIVE DIAGNOSIS: Same.  PROCEDURE: 1. Left arthroscopy with labral repair  SURGEON: Stuart LITTIE Parker MD  ASSISTANT: Conley Dawson, ATC  ANESTHESIA:  general  IV FLUIDS AND URINE: See anesthesia record.  ANTIBIOTICS: Ancef   ESTIMATED BLOOD LOSS: 5 mL.  IMPLANTS:  * No surgical log found *  DRAINS: None  CULTURES: None  COMPLICATIONS: none  DESCRIPTION OF PROCEDURE:  Cartilage Intact femoral and acetabular cartilage   Labrum Torn/Frayed/Normoplastic appearing   Boundaries of labral tear Convention (3 o'clock anterior, 9 o'clock posterior) Anterior boundary: 3 o'clock Posterior boundary: 1 o'clock   OPERATIVE REPORT:  The patient was brought to the operating room, placed supine on the operating table, and bony prominences were padded.  The traction boots were applied with padding to ensure that safe traction could be applied through the feet.  The contralateral limb was abducted maximally and light traction was applied.  The operative leg was brought into neutral position.  The flouroscopic c-arm was brought between the legs for an AP image.  The patient was prepped and draped in a sterile fashion.  Time-out was performed and landmarks were identified. Traction was obtained and care was taken to ensure the least amount of force necessary to allow safe access to the joint of 8-26mm.  This was checked with fluoroscopy.    Next we placed an anterolateral portal under the assistance of fluoroscopy.  First, fluoroscopy was used to estimate the trajectory and starting point.  A 5mm incision with a #11 blade was made and a straight hemostat was used to dilate the portal through the appropriate tract.  We  Ou placed a 14-gauge hypodermic needle with careful technique to be as close to the femoral head as possible and parallel to the sorcele to ensure no iatrogenic damage to the labrum.  This released the negative pressure environment and the amount of traction was adjusted to maintain the 8-80mm of distraction.  A nitinol wire was placed through the needle and flouroscopy was used to ensure it extended to the medial wall of the acetabulum.  The Flowport from [employer] was placed over the wire and the nitinol wire was retracted to just inside the capsule during insertion of the dilator and cannula to minimize the risk of breakage. The arthroscope was placed next and we visualized the anterior triangle.     We Salzwedel placed the anterior portal under direct visualization using the technique described above.  This was safely placed as well without damage to the labrum or femoral head.  We Kolenda switched our arthroscope to the anterior portal to ensure we were not through the labrum - we were safely through the capsule only.  We Gibbons proceeded with periportal capsulotomies utilizing the Samurai blade in each portal without connecting the two.  We identified the anterior inferior iliac spine proximally, the psoas tendon medially and the rectus tendon laterally as landmarks.  We Chenevert proceeded with a diagnostic arthroscopy - the results can be found in the findings section above.    We Turri used the radiofrequency device to clear the superior acetabulum and expose the subspinous region.  Next we exposed the acetabular rim leaving the chondral labral junction intact.    When adequate  reshaping was obtained we Griswold proceeded with the labral repair. We placed 2 anchors at the 1:00 and 2:30 positions. The sutures were passed using the crescent Nanopass from [employer].  This resulted in anatomic labral repair.  We debrided the loose cartilage at the rim and residual degenerative labral tissue.  Traction was let  down with total traction time of 30 minutes.     Finally, we performed a complete capsular closure with tape suture.  She was replaced in the anterior and posterior limb of the reported capsulotomy with excellent apposition. We Strozier removed the arthroscope and closed the incisions with 3-0 nylon simple stitches.  A sterile dressing was applied..  The patient was awakened from anesthesia and transferred to PACU in stable condition. Postoperative care includes:       POSTOPERATIVE PLAN:    Weight bearing as tolerated operative extremity Formal physical therapy will begin immediately within the first weeks of surgery ASA 325 Daily for DVT prophylaxis     Stuart LITTIE Parker, MD 10:13 AM

## 2024-04-03 ENCOUNTER — Encounter (HOSPITAL_BASED_OUTPATIENT_CLINIC_OR_DEPARTMENT_OTHER): Payer: Self-pay | Admitting: Physical Therapy

## 2024-04-03 ENCOUNTER — Other Ambulatory Visit: Payer: Self-pay

## 2024-04-03 ENCOUNTER — Ambulatory Visit (HOSPITAL_BASED_OUTPATIENT_CLINIC_OR_DEPARTMENT_OTHER): Attending: Orthopaedic Surgery | Admitting: Physical Therapy

## 2024-04-03 DIAGNOSIS — M25552 Pain in left hip: Secondary | ICD-10-CM | POA: Insufficient documentation

## 2024-04-03 DIAGNOSIS — M25652 Stiffness of left hip, not elsewhere classified: Secondary | ICD-10-CM | POA: Insufficient documentation

## 2024-04-03 DIAGNOSIS — S73192A Other sprain of left hip, initial encounter: Secondary | ICD-10-CM | POA: Diagnosis not present

## 2024-04-03 DIAGNOSIS — R2689 Other abnormalities of gait and mobility: Secondary | ICD-10-CM | POA: Insufficient documentation

## 2024-04-03 NOTE — Therapy (Signed)
 OUTPATIENT PHYSICAL THERAPY LOWER EXTREMITY EVALUATION   Patient Name: Stuart Wade MRN: [id number] DOB:Dec 27, 1985, 38 y.o., male Today's Date: 04/03/2024  END OF SESSION:  PT End of Session - 04/03/24 1202     Visit Number 1    Number of Visits 16    Date for PT Re-Evaluation 05/29/24    PT Start Time 1151    PT Stop Time 1228    PT Time Calculation (min) 37 min    Activity Tolerance Patient tolerated treatment well    Behavior During Therapy [facility] for tasks assessed/performed          Past Medical History:  Diagnosis Date   Anxiety    Depression    Past Surgical History:  Procedure Laterality Date   ADENOIDECTOMY     ORIF CLAVICULAR FRACTURE Left 11/01/2020   Procedure: LEFT OPEN REDUCTION INTERNAL FIXATION (ORIF) CLAVICULAR FRACTURE;  Surgeon: Addie Cordella Hamilton, MD;  Location: [facility] OR;  Service: Orthopedics;  Laterality: Left;   TEAR DUCT PROBING     TONSILLECTOMY     WISDOM TOOTH EXTRACTION     Patient Active Problem List   Diagnosis Date Noted   Displaced fracture of shaft of left clavicle, initial encounter for closed fracture    Left hand pain 12/27/2017    PCP: Garnette Gleason MD   REFERRING PROVIDER: Dr Elspeth Parker   REFERRING DIAG: Left hip Labral repair   THERAPY DIAG:  Pain in left hip  Stiffness of left hip, not elsewhere classified  Other abnormalities of gait and mobility  Rationale for Evaluation and Treatment: Rehabilitation  ONSET DATE: 03/30/2024  SUBJECTIVE:   SUBJECTIVE STATEMENT: The patient has a history of left hip pain. He was found to have a labrum tear. He had a repair on 03/30/2024. At this time his pain is well controlled. 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 He used the crutch for a short time but has not had to use it since.   PERTINENT HISTORY: Left clavicle fx,  PAIN:  Are you having pain? Yes: NPRS scale: 1-2/10 right now  Pain location: left hip  Pain description: mild tightness and ache   Aggravating factors: prolonged position  Relieving factors: Movement   PRECAUTIONS: None  RED FLAGS: None   WEIGHT BEARING RESTRICTIONS: Yes WBAT  FALLS:  Has patient fallen in last 6 months? No  LIVING ENVIRONMENT:  OCCUPATION:  Works in people and culture      Hobbies/Recreation: Hockey Has his own gym in the basement  Hiking       PLOF: Independent  PATIENT GOALS:    NEXT MD VISIT:    OBJECTIVE:  Note: Objective measures were completed at Evaluation unless otherwise noted.  DIAGNOSTIC FINDINGS:    PATIENT SURVEYS:  LEFS  Extreme difficulty/unable (0), Quite a bit of difficulty (1), Moderate difficulty (2), Little difficulty (3), No difficulty (4) Survey date:    Any of your usual work, housework or school activities   2. Usual hobbies, recreational or sporting activities   3. Getting into/out of the bath   4. Walking between rooms   5. Putting on socks/shoes   6. Squatting    7. Lifting an object, like a bag of groceries from the floor   8. Performing light activities around your home   9. Performing heavy activities around your home   10. Getting into/out of a car   11. Walking 2 blocks   12. Walking 1 mile  13. Going up/down 10 stairs (1 flight)   14. Standing for 1 hour   15.  sitting for 1 hour   16. Running on even ground   17. Running on uneven ground   18. Making sharp turns while running fast   19. Hopping    20. Rolling over in bed   Score total:  47/80     COGNITION: Overall cognitive status: Within functional limits for tasks assessed     SENSATION: WFL    POSTURE: No Significant postural limitations  PALPATION: Mild tenderness to palpation in the anterior hip   LOWER EXTREMITY ROM:  Passive ROM Right eval Left eval  Hip flexion    Hip extension    Hip abduction    Hip adduction    Hip internal rotation    Hip external rotation    Knee flexion    Knee extension    Ankle dorsiflexion    Ankle  plantarflexion    Ankle inversion    Ankle eversion     (Blank rows = not tested)  LOWER EXTREMITY MMT:  MMT Right eval Left eval  Hip flexion    Hip extension    Hip abduction    Hip adduction    Hip internal rotation    Hip external rotation    Knee flexion    Knee extension    Ankle dorsiflexion    Ankle plantarflexion    Ankle inversion    Ankle eversion     (Blank rows = not tested)   GAIT:                                                                                                                                TREATMENT DATE:  7/18  Bandage change- no s/s of infection; bandages removed, sutures in place, clean dry intact; tegaderm and fresh gauze replaced   There-ex:   PROM into flexion and ER per protocol  Prone knee flexion x12  Bent knee fall out x12  PPT x12  Quadruped rocking   Review of HEP and how to use   Manual:  Review of self soft tissue mobilization to glute and anterior hip    PATIENT EDUCATION:  Education details: HEP, symptom management, progression of activity. Person educated: Patient Education method: Explanation, Demonstration, Tactile cues, Verbal cues, and Handouts Education comprehension: verbalized understanding, returned demonstration, verbal cues required, tactile cues required, and needs further education  HOME EXERCISE PROGRAM:   ASSESSMENT:  CLINICAL IMPRESSION: Patient is a 38 year old male status post left hip labral repair on 03/30/2024.  At this time his pain is well-controlled.  He presents with expected limitations in range of motion, strength, and general functional mobility.  He would benefit from skilled therapy to return to active lifestyle. OBJECTIVE IMPAIRMENTS: Abnormal gait, decreased activity tolerance, decreased mobility, difficulty walking, decreased ROM, decreased strength, and pain.   ACTIVITY LIMITATIONS: carrying, lifting, bending, standing, squatting, stairs, transfers, and locomotion  level  PARTICIPATION LIMITATIONS: meal prep, cleaning, driving, shopping, community activity, occupation, and yard work  PERSONAL FACTORS: None   REHAB POTENTIAL: Excellent  CLINICAL DECISION MAKING: Stable/uncomplicated  EVALUATION COMPLEXITY: Low   GOALS: Goals reviewed with patient? Yes  SHORT TERM GOALS: Target date: 05/01/2024   Patient will increase passive left hip flexion to 90 degrees  Baseline: Goal status: INITIAL  2.  Patient will ambulate without AD without pain > 500'  Baseline:  Goal status: INITIAL  3.  Patient will be independent with base HEP  Baseline:  Goal status: INITIAL  4.  Patient will demonstrate 30 degrees of passive hip ER  Baseline:  Goal status: INITIAL    LONG TERM GOALS: Target date: 05/29/2024    Patient will go up/down 8 steps with reciprocal pattern without pain  Baseline:  Goal status: INITIAL  2.  Patient will stand for > 40 min without pain in order to perform ADL's  Baseline:  Goal status: INITIAL  3.  Patient will ambulate community distances without pain  Baseline:  Goal status: INITIAL    PLAN:  PT FREQUENCY: 2x/week  PT DURATION: 8 weeks  PLANNED INTERVENTIONS: 97110-Therapeutic exercises, 97530- Therapeutic activity, [id number]- Neuromuscular re-education, 97535- Self Care, [id number]- Manual therapy, [phone]- Gait training, [phone]- Aquatic Therapy, [id number]- Electrical stimulation (unattended), 97035- Ultrasound, Patient/Family education, Stair training, Taping, Dry Needling, DME instructions, Cryotherapy, and Moist heat    PLAN FOR NEXT SESSION:  Continue per protocol    Alm JINNY Don, PT 04/03/2024, 12:47 PM

## 2024-04-04 NOTE — Therapy (Unsigned)
 OUTPATIENT PHYSICAL THERAPY LOWER EXTREMITY TREATMENT   Patient Name: Stuart Wade MRN: [id number] DOB:Jul 22, 1986, 38 y.o., male Today's Date: 04/08/2024  END OF SESSION:  PT End of Session - 04/08/24 0958     Visit Number 2    Number of Visits 16    Date for PT Re-Evaluation 05/29/24    PT Start Time 0934    PT Stop Time 1012    PT Time Calculation (min) 38 min    Activity Tolerance Patient tolerated treatment well    Behavior During Therapy [facility] for tasks assessed/performed           Past Medical History:  Diagnosis Date   Anxiety    Depression    Past Surgical History:  Procedure Laterality Date   ADENOIDECTOMY     ORIF CLAVICULAR FRACTURE Left 11/01/2020   Procedure: LEFT OPEN REDUCTION INTERNAL FIXATION (ORIF) CLAVICULAR FRACTURE;  Surgeon: Addie Cordella Hamilton, MD;  Location: [facility] OR;  Service: Orthopedics;  Laterality: Left;   TEAR DUCT PROBING     TONSILLECTOMY     WISDOM TOOTH EXTRACTION     Patient Active Problem List   Diagnosis Date Noted   Displaced fracture of shaft of left clavicle, initial encounter for closed fracture    Left hand pain 12/27/2017    PCP: Garnette Gleason MD   REFERRING PROVIDER: Dr Elspeth Parker   REFERRING DIAG: Left hip Labral repair   THERAPY DIAG:  Pain in left hip  Stiffness of left hip, not elsewhere classified  Other abnormalities of gait and mobility  Rationale for Evaluation and Treatment: Rehabilitation  ONSET DATE: 03/30/2024  SUBJECTIVE:   SUBJECTIVE STATEMENT: I am feeling really good. I am not having any pain.   The patient has a history of left hip pain. He was found to have a labrum tear. He had a repair on 03/30/2024. At this time his pain is well controlled. 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 He used the crutch for a short time but has not had to use it since.   PERTINENT HISTORY: Left clavicle fx,  PAIN:  Are you having pain? Yes: NPRS scale: 1-2/10 right now  Pain location: left  hip  Pain description: mild tightness and ache  Aggravating factors: prolonged position  Relieving factors: Movement   PRECAUTIONS: None  RED FLAGS: None   WEIGHT BEARING RESTRICTIONS: Yes WBAT  FALLS:  Has patient fallen in last 6 months? No  LIVING ENVIRONMENT:  OCCUPATION:  Works in people and culture      Hobbies/Recreation: Hockey Has his own gym in the basement  Hiking       PLOF: Independent  PATIENT GOALS:    NEXT MD VISIT:    OBJECTIVE:  Note: Objective measures were completed at Evaluation unless otherwise noted.  DIAGNOSTIC FINDINGS:    PATIENT SURVEYS:  LEFS  Extreme difficulty/unable (0), Quite a bit of difficulty (1), Moderate difficulty (2), Little difficulty (3), No difficulty (4) Survey date:    Any of your usual work, housework or school activities   2. Usual hobbies, recreational or sporting activities   3. Getting into/out of the bath   4. Walking between rooms   5. Putting on socks/shoes   6. Squatting    7. Lifting an object, like a bag of groceries from the floor   8. Performing light activities around your home   9. Performing heavy activities around your home   10. Getting into/out of a  car   11. Walking 2 blocks   12. Walking 1 mile   13. Going up/down 10 stairs (1 flight)   14. Standing for 1 hour   15.  sitting for 1 hour   16. Running on even ground   17. Running on uneven ground   18. Making sharp turns while running fast   19. Hopping    20. Rolling over in bed   Score total:  47/80     COGNITION: Overall cognitive status: Within functional limits for tasks assessed     SENSATION: WFL    POSTURE: No Significant postural limitations  PALPATION: Mild tenderness to palpation in the anterior hip   LOWER EXTREMITY ROM:  Passive ROM Right eval Left eval  Hip flexion    Hip extension    Hip abduction    Hip adduction    Hip internal rotation    Hip external rotation    Knee flexion    Knee  extension    Ankle dorsiflexion    Ankle plantarflexion    Ankle inversion    Ankle eversion     (Blank rows = not tested)  LOWER EXTREMITY MMT:  MMT Right eval Left eval  Hip flexion    Hip extension    Hip abduction    Hip adduction    Hip internal rotation    Hip external rotation    Knee flexion    Knee extension    Ankle dorsiflexion    Ankle plantarflexion    Ankle inversion    Ankle eversion     (Blank rows = not tested)   GAIT:                                                                                                                                TREATMENT DATE:  07/23 Upright bike 5 min PROM into flexion and ER per protocol AAROM heel slides with focus on hip flexion <90 degrees.  Quad sets  [employer]    7/18  Bandage change- no s/s of infection; bandages removed, sutures in place, clean dry intact; tegaderm and fresh gauze replaced   There-ex:  PROM into flexion and ER per protocol  Prone knee flexion x12  Bent knee fall out x12  PPT x12  Quadruped rocking   Review of HEP and how to use   Manual:  Review of self soft tissue mobilization to glute and anterior hip    PATIENT EDUCATION:  Education details: HEP, symptom management, progression of activity. Person educated: Patient Education method: Explanation, Demonstration, Tactile cues, Verbal cues, and Handouts Education comprehension: verbalized understanding, returned demonstration, verbal cues required, tactile cues required, and needs further education  HOME EXERCISE PROGRAM:   ASSESSMENT:  CLINICAL IMPRESSION: Pt is progressing well per protocol. He tolerated all prescribed all exercises well today with no pain. Pt will continue to benefit from skilled PT to address continued deficits.  Patient is a 38 year old male status post left hip labral repair on 03/30/2024.  At this time his pain is well-controlled.  He presents with expected limitations in  range of motion, strength, and general functional mobility.  He would benefit from skilled therapy to return to active lifestyle. OBJECTIVE IMPAIRMENTS: Abnormal gait, decreased activity tolerance, decreased mobility, difficulty walking, decreased ROM, decreased strength, and pain.   ACTIVITY LIMITATIONS: carrying, lifting, bending, standing, squatting, stairs, transfers, and locomotion level  PARTICIPATION LIMITATIONS: meal prep, cleaning, driving, shopping, community activity, occupation, and yard work  PERSONAL FACTORS: None   REHAB POTENTIAL: Excellent  CLINICAL DECISION MAKING: Stable/uncomplicated  EVALUATION COMPLEXITY: Low   GOALS: Goals reviewed with patient? Yes  SHORT TERM GOALS: Target date: 05/01/2024   Patient will increase passive left hip flexion to 90 degrees  Baseline: Goal status: INITIAL  2.  Patient will ambulate without AD without pain > 500'  Baseline:  Goal status: INITIAL  3.  Patient will be independent with base HEP  Baseline:  Goal status: INITIAL  4.  Patient will demonstrate 30 degrees of passive hip ER  Baseline:  Goal status: INITIAL    LONG TERM GOALS: Target date: 05/29/2024    Patient will go up/down 8 steps with reciprocal pattern without pain  Baseline:  Goal status: INITIAL  2.  Patient will stand for > 40 min without pain in order to perform ADL's  Baseline:  Goal status: INITIAL  3.  Patient will ambulate community distances without pain  Baseline:  Goal status: INITIAL    PLAN:  PT FREQUENCY: 2x/week  PT DURATION: 8 weeks  PLANNED INTERVENTIONS: 97110-Therapeutic exercises, 97530- Therapeutic activity, [id number]- Neuromuscular re-education, 97535- Self Care, [id number]- Manual therapy, [id number]- Gait training, [phone]- Aquatic Therapy, 97014- Electrical stimulation (unattended), 97035- Ultrasound, Patient/Family education, Stair training, Taping, Dry Needling, DME instructions, Cryotherapy, and Moist heat    PLAN FOR NEXT  SESSION:  Continue per protocol    Rojean JONELLE Batten, PT 04/08/2024, 10:11 AM

## 2024-04-08 ENCOUNTER — Ambulatory Visit (HOSPITAL_BASED_OUTPATIENT_CLINIC_OR_DEPARTMENT_OTHER): Admitting: Physical Therapy

## 2024-04-08 ENCOUNTER — Encounter (HOSPITAL_BASED_OUTPATIENT_CLINIC_OR_DEPARTMENT_OTHER): Payer: Self-pay | Admitting: Physical Therapy

## 2024-04-08 ENCOUNTER — Encounter (HOSPITAL_BASED_OUTPATIENT_CLINIC_OR_DEPARTMENT_OTHER): Admitting: Orthopaedic Surgery

## 2024-04-08 DIAGNOSIS — R2689 Other abnormalities of gait and mobility: Secondary | ICD-10-CM | POA: Diagnosis not present

## 2024-04-08 DIAGNOSIS — S73192A Other sprain of left hip, initial encounter: Secondary | ICD-10-CM | POA: Diagnosis not present

## 2024-04-08 DIAGNOSIS — M25652 Stiffness of left hip, not elsewhere classified: Secondary | ICD-10-CM | POA: Diagnosis not present

## 2024-04-08 DIAGNOSIS — M25552 Pain in left hip: Secondary | ICD-10-CM | POA: Diagnosis not present

## 2024-04-13 ENCOUNTER — Ambulatory Visit (HOSPITAL_BASED_OUTPATIENT_CLINIC_OR_DEPARTMENT_OTHER): Admitting: Orthopaedic Surgery

## 2024-04-13 DIAGNOSIS — S73192A Other sprain of left hip, initial encounter: Secondary | ICD-10-CM

## 2024-04-13 NOTE — Progress Notes (Signed)
 Post Operative Evaluation    Procedure/Date of Surgery: Left hip labral repair 7/14  Interval History:   Presents 2 weeks status post the above procedure.  Overall doing extremely well.  Has minimal to no pain.  He has been working well with physical therapy.  Fully weightbearing without assistive devices   PMH/PSH/Family History/Social History/Meds/Allergies:    Past Medical History:  Diagnosis Date   Anxiety    Depression    Past Surgical History:  Procedure Laterality Date   ADENOIDECTOMY     ORIF CLAVICULAR FRACTURE Left 11/01/2020   Procedure: LEFT OPEN REDUCTION INTERNAL FIXATION (ORIF) CLAVICULAR FRACTURE;  Surgeon: Addie Cordella Hamilton, MD;  Location: [facility] OR;  Service: Orthopedics;  Laterality: Left;   TEAR DUCT PROBING     TONSILLECTOMY     WISDOM TOOTH EXTRACTION     Social History   Socioeconomic History   Marital status: Married    Spouse name: Not on file   Number of children: Not on file   Years of education: Not on file   Highest education level: Not on file  Occupational History   Not on file  Tobacco Use   Smoking status: Never   Smokeless tobacco: Never  Vaping Use   Vaping status: Never Used  Substance and Sexual Activity   Alcohol use: Yes    Comment: occ   Drug use: Never   Sexual activity: Not on file  Other Topics Concern   Not on file  Social History Narrative   Not on file   Social Drivers of Health   Financial Resource Strain: Not on file  Food Insecurity: Not on file  Transportation Needs: Not on file  Physical Activity: Not on file  Stress: Not on file  Social Connections: Unknown (01/19/2022)   Received from [facility]   Social Network    Social Network: Not on file   No family history on file. No Known Allergies Current Outpatient Medications  Medication Sig Dispense Refill   aspirin  EC 325 MG tablet Take 1 tablet (325 mg total) by mouth daily. 14 tablet 0   aspirin  EC 325 MG tablet  Take 1 tablet (325 mg total) by mouth daily. 14 tablet 0   b complex vitamins capsule Take 1 capsule by mouth daily.     ketorolac  (TORADOL ) 10 MG tablet Take 1 tablet (10 mg total) by mouth every 8 (eight) hours as needed. 15 tablet 0   methocarbamol  (ROBAXIN ) 500 MG tablet Take 1 tablet (500 mg total) by mouth every 8 (eight) hours as needed. 30 tablet 0   oxyCODONE  (ROXICODONE ) 5 MG immediate release tablet Take 1 tablet (5 mg total) by mouth every 4 (four) hours as needed for severe pain (pain score 7-10) or breakthrough pain. 10 tablet 0   oxyCODONE  (ROXICODONE ) 5 MG immediate release tablet Take 1 tablet (5 mg total) by mouth every 4 (four) hours as needed for severe pain (pain score 7-10) or breakthrough pain. 5 tablet 0   No current facility-administered medications for this visit.   No results found.  Review of Systems:   A ROS was performed including pertinent positives and negatives as documented in the HPI.   Musculoskeletal Exam:    There were no vitals taken for this visit.  Hip incision is well-appearing without erythema or drainage.  30 degrees internal/external rotation left hip without pain.  Good abduction strength is neurosensory exam is intact  Imaging:      I personally reviewed and interpreted the radiographs.   Assessment:   We status post left hip arthroscopic labral pair overall doing very well.  This time we will continue work on strengthening active range of motion.  Open see him back in 4 weeks for reassessment  Plan :    - Return to clinic 4 weeks for reassessment      I personally saw and evaluated the patient, and participated in the management and treatment plan.  Elspeth Parker, MD Attending Physician, Orthopedic Surgery  This document was dictated using Dragon voice recognition software. A reasonable attempt at proof reading has been made to minimize errors.

## 2024-04-15 ENCOUNTER — Ambulatory Visit (HOSPITAL_BASED_OUTPATIENT_CLINIC_OR_DEPARTMENT_OTHER): Admitting: Physical Therapy

## 2024-04-15 ENCOUNTER — Encounter (HOSPITAL_BASED_OUTPATIENT_CLINIC_OR_DEPARTMENT_OTHER): Payer: Self-pay | Admitting: Physical Therapy

## 2024-04-15 DIAGNOSIS — R2689 Other abnormalities of gait and mobility: Secondary | ICD-10-CM | POA: Diagnosis not present

## 2024-04-15 DIAGNOSIS — M25652 Stiffness of left hip, not elsewhere classified: Secondary | ICD-10-CM

## 2024-04-15 DIAGNOSIS — M25552 Pain in left hip: Secondary | ICD-10-CM

## 2024-04-15 DIAGNOSIS — S73192A Other sprain of left hip, initial encounter: Secondary | ICD-10-CM | POA: Diagnosis not present

## 2024-04-15 NOTE — Therapy (Signed)
 OUTPATIENT PHYSICAL THERAPY LOWER EXTREMITY TREATMENT   Patient Name: Stuart Wade MRN: [id number] DOB:[birth year], 38 y.o., male Today's Date: 04/15/2024  END OF SESSION:  PT End of Session - 04/15/24 1441     Visit Number 3    Number of Visits 16    Date for PT Re-Evaluation 05/29/24            Past Medical History:  Diagnosis Date   Anxiety    Depression    Past Surgical History:  Procedure Laterality Date   ADENOIDECTOMY     ORIF CLAVICULAR FRACTURE Left 11/01/2020   Procedure: LEFT OPEN REDUCTION INTERNAL FIXATION (ORIF) CLAVICULAR FRACTURE;  Surgeon: Addie Cordella Hamilton, MD;  Location: [facility] OR;  Service: Orthopedics;  Laterality: Left;   TEAR DUCT PROBING     TONSILLECTOMY     WISDOM TOOTH EXTRACTION     Patient Active Problem List   Diagnosis Date Noted   Displaced fracture of shaft of left clavicle, initial encounter for closed fracture    Left hand pain 12/27/2017    PCP: Garnette Gleason MD   REFERRING PROVIDER: Dr Elspeth Parker   REFERRING DIAG: Left hip Labral repair   THERAPY DIAG:  No diagnosis found.  Rationale for Evaluation and Treatment: Rehabilitation  ONSET DATE: 03/30/2024  SUBJECTIVE:   SUBJECTIVE STATEMENT: The patient continues to have no pain. He is doing well.   The patient has a history of left hip pain. He was found to have a labrum tear. He had a repair on 03/30/2024. At this time his pain is well controlled. 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 He used the crutch for a short time but has not had to use it since.   PERTINENT HISTORY: Left clavicle fx,  PAIN:  Are you having pain? Yes: NPRS scale: 1-2/10 right now  Pain location: left hip  Pain description: mild tightness and ache  Aggravating factors: prolonged position  Relieving factors: Movement   PRECAUTIONS: None  RED FLAGS: None   WEIGHT BEARING RESTRICTIONS: Yes WBAT  FALLS:  Has patient fallen in last 6 months? No  LIVING  ENVIRONMENT:  OCCUPATION:  Works in people and culture      Hobbies/Recreation: Hockey Has his own gym in the basement  Hiking       PLOF: Independent  PATIENT GOALS:    NEXT MD VISIT:    OBJECTIVE:  Note: Objective measures were completed at Evaluation unless otherwise noted.  DIAGNOSTIC FINDINGS:    PATIENT SURVEYS:  LEFS  Extreme difficulty/unable (0), Quite a bit of difficulty (1), Moderate difficulty (2), Little difficulty (3), No difficulty (4) Survey date:    Any of your usual work, housework or school activities   2. Usual hobbies, recreational or sporting activities   3. Getting into/out of the bath   4. Walking between rooms   5. Putting on socks/shoes   6. Squatting    7. Lifting an object, like a bag of groceries from the floor   8. Performing light activities around your home   9. Performing heavy activities around your home   10. Getting into/out of a car   11. Walking 2 blocks   12. Walking 1 mile   13. Going up/down 10 stairs (1 flight)   14. Standing for 1 hour   15.  sitting for 1 hour   16. Running on even ground   17. Running on uneven ground   18. Making sharp turns  while running fast   19. Hopping    20. Rolling over in bed   Score total:  47/80     COGNITION: Overall cognitive status: Within functional limits for tasks assessed     SENSATION: WFL    POSTURE: No Significant postural limitations  PALPATION: Mild tenderness to palpation in the anterior hip   LOWER EXTREMITY ROM:  Passive ROM Right eval Left eval  Hip flexion    Hip extension    Hip abduction    Hip adduction    Hip internal rotation    Hip external rotation    Knee flexion    Knee extension    Ankle dorsiflexion    Ankle plantarflexion    Ankle inversion    Ankle eversion     (Blank rows = not tested)  LOWER EXTREMITY MMT:  MMT Right eval Left eval  Hip flexion    Hip extension    Hip abduction    Hip adduction    Hip internal  rotation    Hip external rotation    Knee flexion    Knee extension    Ankle dorsiflexion    Ankle plantarflexion    Ankle inversion    Ankle eversion     (Blank rows = not tested)   GAIT:                                                                                                                                TREATMENT DATE:  7/30 Manual:  Trigger point release to anterior hip There-ex: PROM into all planes  Upright bike 5 min  Bent knee fallout x15  Prone knee flexion  Prone hip extension 2x15   Neuro-re-ed:  Bridge 3x12  Clamshell 3x12  both with cuing for TA breathing    07/23 Upright bike 5 min PROM into flexion and ER per protocol AAROM heel slides with focus on hip flexion <90 degrees.  Quad sets  [employer]    7/18  Bandage change- no s/s of infection; bandages removed, sutures in place, clean dry intact; tegaderm and fresh gauze replaced   There-ex:  PROM into flexion and ER per protocol  Prone knee flexion x12  Bent knee fall out x12  PPT x12  Quadruped rocking   Review of HEP and how to use   Manual:  Review of self soft tissue mobilization to glute and anterior hip    PATIENT EDUCATION:  Education details: HEP, symptom management, progression of activity. Person educated: Patient Education method: Explanation, Demonstration, Tactile cues, Verbal cues, and Handouts Education comprehension: verbalized understanding, returned demonstration, verbal cues required, tactile cues required, and needs further education  HOME EXERCISE PROGRAM:  [id number]  ASSESSMENT:  CLINICAL IMPRESSION: The patient tolerated treatment well. We advanced I'm into week three exercises with no  pain. He reports he has been squatting at home without pain He was advised to be careful squatting deep  right now. He was given an updated HEP> We will continue to progress as tolerated.    Patient is a 38 year old male status post left hip labral  repair on 03/30/2024.  At this time his pain is well-controlled.  He presents with expected limitations in range of motion, strength, and general functional mobility.  He would benefit from skilled therapy to return to active lifestyle. OBJECTIVE IMPAIRMENTS: Abnormal gait, decreased activity tolerance, decreased mobility, difficulty walking, decreased ROM, decreased strength, and pain.   ACTIVITY LIMITATIONS: carrying, lifting, bending, standing, squatting, stairs, transfers, and locomotion level  PARTICIPATION LIMITATIONS: meal prep, cleaning, driving, shopping, community activity, occupation, and yard work  PERSONAL FACTORS: None   REHAB POTENTIAL: Excellent  CLINICAL DECISION MAKING: Stable/uncomplicated  EVALUATION COMPLEXITY: Low   GOALS: Goals reviewed with patient? Yes  SHORT TERM GOALS: Target date: 05/01/2024   Patient will increase passive left hip flexion to 90 degrees  Baseline: Goal status: INITIAL  2.  Patient will ambulate without AD without pain > 500'  Baseline:  Goal status: INITIAL  3.  Patient will be independent with base HEP  Baseline:  Goal status: INITIAL  4.  Patient will demonstrate 30 degrees of passive hip ER  Baseline:  Goal status: INITIAL    LONG TERM GOALS: Target date: 05/29/2024    Patient will go up/down 8 steps with reciprocal pattern without pain  Baseline:  Goal status: INITIAL  2.  Patient will stand for > 40 min without pain in order to perform ADL's  Baseline:  Goal status: INITIAL  3.  Patient will ambulate community distances without pain  Baseline:  Goal status: INITIAL    PLAN:  PT FREQUENCY: 2x/week  PT DURATION: 8 weeks  PLANNED INTERVENTIONS: 97110-Therapeutic exercises, 97530- Therapeutic activity, [id number]- Neuromuscular re-education, 97535- Self Care, 02859- Manual therapy, [phone]- Gait training, [phone]- Aquatic Therapy, 97014- Electrical stimulation (unattended), [phone]- Ultrasound, Patient/Family education,  Stair training, Taping, Dry Needling, DME instructions, Cryotherapy, and Moist heat    PLAN FOR NEXT SESSION:  Continue per protocol    Alm JINNY Don, PT 04/15/2024, 2:43 PM

## 2024-04-20 ENCOUNTER — Encounter (HOSPITAL_BASED_OUTPATIENT_CLINIC_OR_DEPARTMENT_OTHER): Payer: Self-pay | Admitting: Physical Therapy

## 2024-04-20 ENCOUNTER — Encounter (HOSPITAL_BASED_OUTPATIENT_CLINIC_OR_DEPARTMENT_OTHER): Payer: Self-pay

## 2024-04-20 ENCOUNTER — Ambulatory Visit (HOSPITAL_BASED_OUTPATIENT_CLINIC_OR_DEPARTMENT_OTHER): Attending: Orthopaedic Surgery | Admitting: Physical Therapy

## 2024-04-20 DIAGNOSIS — M25652 Stiffness of left hip, not elsewhere classified: Secondary | ICD-10-CM | POA: Insufficient documentation

## 2024-04-20 DIAGNOSIS — M25552 Pain in left hip: Secondary | ICD-10-CM | POA: Diagnosis not present

## 2024-04-20 DIAGNOSIS — R2689 Other abnormalities of gait and mobility: Secondary | ICD-10-CM | POA: Diagnosis not present

## 2024-04-20 NOTE — Therapy (Signed)
 OUTPATIENT PHYSICAL THERAPY LOWER EXTREMITY TREATMENT   Patient Name: Stuart Wade MRN: [id number] DOB:November 28, 1985, 38 y.o., male Today's Date: 04/20/2024  END OF SESSION:  PT End of Session - 04/20/24 0937     Visit Number 4    Number of Visits 16    Date for PT Re-Evaluation 05/29/24    PT Start Time 0930    PT Stop Time 1010    PT Time Calculation (min) 40 min    Activity Tolerance Patient tolerated treatment well    Behavior During Therapy [facility] for tasks assessed/performed             Past Medical History:  Diagnosis Date   Anxiety    Depression    Past Surgical History:  Procedure Laterality Date   ADENOIDECTOMY     ORIF CLAVICULAR FRACTURE Left 11/01/2020   Procedure: LEFT OPEN REDUCTION INTERNAL FIXATION (ORIF) CLAVICULAR FRACTURE;  Surgeon: Addie Cordella Hamilton, MD;  Location: [facility] OR;  Service: Orthopedics;  Laterality: Left;   TEAR DUCT PROBING     TONSILLECTOMY     WISDOM TOOTH EXTRACTION     Patient Active Problem List   Diagnosis Date Noted   Displaced fracture of shaft of left clavicle, initial encounter for closed fracture    Left hand pain 12/27/2017    PCP: Garnette Gleason MD   REFERRING PROVIDER: Dr Elspeth Parker   REFERRING DIAG: Left hip Labral repair   THERAPY DIAG:  Pain in left hip  Stiffness of left hip, not elsewhere classified  Other abnormalities of gait and mobility  Rationale for Evaluation and Treatment: Rehabilitation  ONSET DATE: 03/30/2024  SUBJECTIVE:   SUBJECTIVE STATEMENT: Pt states that he went ice skating and may have stretched a little too far. He denies any pain. He also reports popping at the end of a long walk, but states that it has been ongoing since the surgery.   The patient has a history of left hip pain. He was found to have a labrum tear. He had a repair on 03/30/2024. At this time his pain is well controlled. 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 He used the crutch for a short time but has  not had to use it since.   PERTINENT HISTORY: Left clavicle fx,  PAIN:  Are you having pain? Yes: NPRS scale: 1-2/10 right now  Pain location: left hip  Pain description: mild tightness and ache  Aggravating factors: prolonged position  Relieving factors: Movement   PRECAUTIONS: None  RED FLAGS: None   WEIGHT BEARING RESTRICTIONS: Yes WBAT  FALLS:  Has patient fallen in last 6 months? No  LIVING ENVIRONMENT:  OCCUPATION:  Works in people and culture      Hobbies/Recreation: Hockey Has his own gym in the basement  Hiking       PLOF: Independent  PATIENT GOALS:    NEXT MD VISIT:    OBJECTIVE:  Note: Objective measures were completed at Evaluation unless otherwise noted.  DIAGNOSTIC FINDINGS:    PATIENT SURVEYS:  LEFS  Extreme difficulty/unable (0), Quite a bit of difficulty (1), Moderate difficulty (2), Little difficulty (3), No difficulty (4) Survey date:    Any of your usual work, housework or school activities   2. Usual hobbies, recreational or sporting activities   3. Getting into/out of the bath   4. Walking between rooms   5. Putting on socks/shoes   6. Squatting    7. Lifting an object, like a  bag of groceries from the floor   8. Performing light activities around your home   9. Performing heavy activities around your home   10. Getting into/out of a car   11. Walking 2 blocks   12. Walking 1 mile   13. Going up/down 10 stairs (1 flight)   14. Standing for 1 hour   15.  sitting for 1 hour   16. Running on even ground   17. Running on uneven ground   18. Making sharp turns while running fast   19. Hopping    20. Rolling over in bed   Score total:  47/80     COGNITION: Overall cognitive status: Within functional limits for tasks assessed     SENSATION: WFL    POSTURE: No Significant postural limitations  PALPATION: Mild tenderness to palpation in the anterior hip   LOWER EXTREMITY ROM:  Passive ROM Right eval  Left eval  Hip flexion    Hip extension    Hip abduction    Hip adduction    Hip internal rotation    Hip external rotation    Knee flexion    Knee extension    Ankle dorsiflexion    Ankle plantarflexion    Ankle inversion    Ankle eversion     (Blank rows = not tested)  LOWER EXTREMITY MMT:  MMT Right eval Left eval  Hip flexion    Hip extension    Hip abduction    Hip adduction    Hip internal rotation    Hip external rotation    Knee flexion    Knee extension    Ankle dorsiflexion    Ankle plantarflexion    Ankle inversion    Ankle eversion     (Blank rows = not tested)   GAIT:                                                                                                                                TREATMENT DATE:  04/20/24 There ex: Upright bike 5 minutes  Prone hip extension 3# 3 x 10 STS from hi- low table with airex pad 2 x 10  Neuro-re-ed:  SL bridges 2x 10  GTB at knees with clam 3 x 10 Sidelying clam GTB 3 x 10 Standing marhces on airex pad to fatigue   7/30 Manual:  Trigger point release to anterior hip There-ex: PROM into all planes  Upright bike 5 min  Bent knee fallout x15  Prone knee flexion  Prone hip extension 2x15   Neuro-re-ed:  Bridge 3x12  Clamshell 3x12  both with cuing for TA breathing    07/23 Upright bike 5 min PROM into flexion and ER per protocol AAROM heel slides with focus on hip flexion <90 degrees.  Quad sets  [employer]    7/18  Bandage change- no s/s of infection; bandages removed, sutures in place, clean dry intact;  tegaderm and fresh gauze replaced   There-ex:  PROM into flexion and ER per protocol  Prone knee flexion x12  Bent knee fall out x12  PPT x12  Quadruped rocking   Review of HEP and how to use   Manual:  Review of self soft tissue mobilization to glute and anterior hip    PATIENT EDUCATION:  Education details: HEP, symptom management, progression of  activity. Person educated: Patient Education method: Explanation, Demonstration, Tactile cues, Verbal cues, and Handouts Education comprehension: verbalized understanding, returned demonstration, verbal cues required, tactile cues required, and needs further education  HOME EXERCISE PROGRAM:  LJWYRFPH  ASSESSMENT:  CLINICAL IMPRESSION: The patient tolerated treatment well. Pt reports some fatigue in his hip flexor at the end of session, but denies any pain. Continued discussion about avoiding deep squats or extreme end of hip mobility at this time on land and ice. We will continue to progress as tolerated.    Patient is a 38 year old male status post left hip labral repair on 03/30/2024.  At this time his pain is well-controlled.  He presents with expected limitations in range of motion, strength, and general functional mobility.  He would benefit from skilled therapy to return to active lifestyle. OBJECTIVE IMPAIRMENTS: Abnormal gait, decreased activity tolerance, decreased mobility, difficulty walking, decreased ROM, decreased strength, and pain.   ACTIVITY LIMITATIONS: carrying, lifting, bending, standing, squatting, stairs, transfers, and locomotion level  PARTICIPATION LIMITATIONS: meal prep, cleaning, driving, shopping, community activity, occupation, and yard work  PERSONAL FACTORS: None   REHAB POTENTIAL: Excellent  CLINICAL DECISION MAKING: Stable/uncomplicated  EVALUATION COMPLEXITY: Low   GOALS: Goals reviewed with patient? Yes  SHORT TERM GOALS: Target date: 05/01/2024   Patient will increase passive left hip flexion to 90 degrees  Baseline: Goal status: INITIAL  2.  Patient will ambulate without AD without pain > 500'  Baseline:  Goal status: INITIAL  3.  Patient will be independent with base HEP  Baseline:  Goal status: INITIAL  4.  Patient will demonstrate 30 degrees of passive hip ER  Baseline:  Goal status: INITIAL    LONG TERM GOALS: Target date:  05/29/2024    Patient will go up/down 8 steps with reciprocal pattern without pain  Baseline:  Goal status: INITIAL  2.  Patient will stand for > 40 min without pain in order to perform ADL's  Baseline:  Goal status: INITIAL  3.  Patient will ambulate community distances without pain  Baseline:  Goal status: INITIAL    PLAN:  PT FREQUENCY: 2x/week  PT DURATION: 8 weeks  PLANNED INTERVENTIONS: 97110-Therapeutic exercises, 97530- Therapeutic activity, [id number]- Neuromuscular re-education, 97535- Self Care, [id number]- Manual therapy, [id number]- Gait training, [phone]- Aquatic Therapy, [id number]- Electrical stimulation (unattended), 97035- Ultrasound, Patient/Family education, Stair training, Taping, Dry Needling, DME instructions, Cryotherapy, and Moist heat    PLAN FOR NEXT SESSION:  Continue per protocol    Rojean JONELLE Batten, PT 04/20/2024, 10:15 AM

## 2024-04-22 ENCOUNTER — Encounter (HOSPITAL_BASED_OUTPATIENT_CLINIC_OR_DEPARTMENT_OTHER): Admitting: Physical Therapy

## 2024-04-27 ENCOUNTER — Encounter (HOSPITAL_BASED_OUTPATIENT_CLINIC_OR_DEPARTMENT_OTHER): Payer: Self-pay | Admitting: Physical Therapy

## 2024-04-27 ENCOUNTER — Ambulatory Visit (HOSPITAL_BASED_OUTPATIENT_CLINIC_OR_DEPARTMENT_OTHER): Admitting: Physical Therapy

## 2024-04-27 DIAGNOSIS — M25652 Stiffness of left hip, not elsewhere classified: Secondary | ICD-10-CM | POA: Diagnosis not present

## 2024-04-27 DIAGNOSIS — R2689 Other abnormalities of gait and mobility: Secondary | ICD-10-CM | POA: Diagnosis not present

## 2024-04-27 DIAGNOSIS — M25552 Pain in left hip: Secondary | ICD-10-CM

## 2024-04-27 NOTE — Therapy (Signed)
 OUTPATIENT PHYSICAL THERAPY LOWER EXTREMITY TREATMENT   Patient Name: Stuart Wade MRN: [id number] DOB:[birth year], 38 y.o., male Today's Date: 04/27/2024  END OF SESSION:  PT End of Session - 04/27/24 1024     Visit Number 5    Number of Visits 16    Date for PT Re-Evaluation 05/29/24    PT Start Time 1021   therapy late   PT Stop Time 1100    PT Time Calculation (min) 39 min    Activity Tolerance Patient tolerated treatment well    Behavior During Therapy [facility] for tasks assessed/performed             Past Medical History:  Diagnosis Date   Anxiety    Depression    Past Surgical History:  Procedure Laterality Date   ADENOIDECTOMY     ORIF CLAVICULAR FRACTURE Left 11/01/2020   Procedure: LEFT OPEN REDUCTION INTERNAL FIXATION (ORIF) CLAVICULAR FRACTURE;  Surgeon: Addie Cordella Hamilton, MD;  Location: [facility] OR;  Service: Orthopedics;  Laterality: Left;   TEAR DUCT PROBING     TONSILLECTOMY     WISDOM TOOTH EXTRACTION     Patient Active Problem List   Diagnosis Date Noted   Displaced fracture of shaft of left clavicle, initial encounter for closed fracture    Left hand pain 12/27/2017    PCP: Garnette Gleason MD   REFERRING PROVIDER: Dr Elspeth Parker   REFERRING DIAG: Left hip Labral repair   THERAPY DIAG:  Pain in left hip  Stiffness of left hip, not elsewhere classified  Other abnormalities of gait and mobility  Rationale for Evaluation and Treatment: Rehabilitation  ONSET DATE: 03/30/2024  SUBJECTIVE:   SUBJECTIVE STATEMENT: The patient reports he has not gone ice skating again and everything has been fine. He has had no pain. He flet good after the last visit.   The patient has a history of left hip pain. He was found to have a labrum tear. He had a repair on 03/30/2024. At this time his pain is well controlled. 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 He used the crutch for a short time but has not had to use it since.   PERTINENT  HISTORY: Left clavicle fx,  PAIN:  Are you having pain? Yes: NPRS scale: 1-2/10 right now  Pain location: left hip  Pain description: mild tightness and ache  Aggravating factors: prolonged position  Relieving factors: Movement   PRECAUTIONS: None  RED FLAGS: None   WEIGHT BEARING RESTRICTIONS: Yes WBAT  FALLS:  Has patient fallen in last 6 months? No  LIVING ENVIRONMENT:  OCCUPATION:  Works in people and culture      Hobbies/Recreation: Hockey Has his own gym in the basement  Hiking       PLOF: Independent  PATIENT GOALS:    NEXT MD VISIT:    OBJECTIVE:  Note: Objective measures were completed at Evaluation unless otherwise noted.  DIAGNOSTIC FINDINGS:    PATIENT SURVEYS:  LEFS  Extreme difficulty/unable (0), Quite a bit of difficulty (1), Moderate difficulty (2), Little difficulty (3), No difficulty (4) Survey date:    Any of your usual work, housework or school activities   2. Usual hobbies, recreational or sporting activities   3. Getting into/out of the bath   4. Walking between rooms   5. Putting on socks/shoes   6. Squatting    7. Lifting an object, like a bag of groceries from the floor   8. Performing  light activities around your home   9. Performing heavy activities around your home   10. Getting into/out of a car   11. Walking 2 blocks   12. Walking 1 mile   13. Going up/down 10 stairs (1 flight)   14. Standing for 1 hour   15.  sitting for 1 hour   16. Running on even ground   17. Running on uneven ground   18. Making sharp turns while running fast   19. Hopping    20. Rolling over in bed   Score total:  47/80     COGNITION: Overall cognitive status: Within functional limits for tasks assessed     SENSATION: WFL    POSTURE: No Significant postural limitations  PALPATION: Mild tenderness to palpation in the anterior hip   LOWER EXTREMITY ROM:  Passive ROM Right eval Left eval  Hip flexion    Hip extension     Hip abduction    Hip adduction    Hip internal rotation    Hip external rotation    Knee flexion    Knee extension    Ankle dorsiflexion    Ankle plantarflexion    Ankle inversion    Ankle eversion     (Blank rows = not tested)  LOWER EXTREMITY MMT:  MMT Right eval Left eval  Hip flexion    Hip extension    Hip abduction    Hip adduction    Hip internal rotation    Hip external rotation    Knee flexion    Knee extension    Ankle dorsiflexion    Ankle plantarflexion    Ankle inversion    Ankle eversion     (Blank rows = not tested)   GAIT:                                                                                                                                TREATMENT DATE:  04/25/2024 There ex: Upright bike 5 minutes  Prone hip extension 3# 3 x 10 LAQ 5 lbs 3x12   Ther-act:  Step up 3x12 4 inch  Lateral Step up 3x12 4 inch   Neuro-re-ed:  Step onto air-ex fwd and lateral 2x15 each         04/20/24 There ex: Upright bike 5 minutes  Prone hip extension 3# 3 x 10 STS from hi- low table with airex pad 2 x 10  Neuro-re-ed:  SL bridges 2x 10  GTB at knees with clam 3 x 10 Sidelying clam GTB 3 x 10 Standing marhces on airex pad to fatigue   7/30 Manual:  Trigger point release to anterior hip There-ex: PROM into all planes  Upright bike 5 min  Bent knee fallout x15  Prone knee flexion  Prone hip extension 2x15   Neuro-re-ed:  Bridge 3x12  Clamshell 3x12  both with cuing for TA breathing    07/23 Upright bike 5 min  PROM into flexion and ER per protocol AAROM heel slides with focus on hip flexion <90 degrees.  Quad sets  [employer]    7/18  Bandage change- no s/s of infection; bandages removed, sutures in place, clean dry intact; tegaderm and fresh gauze replaced   There-ex:  PROM into flexion and ER per protocol  Prone knee flexion x12  Bent knee fall out x12  PPT x12  Quadruped rocking   Review  of HEP and how to use   Manual:  Review of self soft tissue mobilization to glute and anterior hip    PATIENT EDUCATION:  Education details: HEP, symptom management, progression of activity. Person educated: Patient Education method: Explanation, Demonstration, Tactile cues, Verbal cues, and Handouts Education comprehension: verbalized understanding, returned demonstration, verbal cues required, tactile cues required, and needs further education  HOME EXERCISE PROGRAM:  LJWYRFPH  ASSESSMENT:  CLINICAL IMPRESSION: The patient continues to progress well. We progressed him into light functional strength today. He had no significant pain. We kept his RPE low. We will continue to advance his program as tolerated.   Patient is a 38 year old male status post left hip labral repair on 03/30/2024.  At this time his pain is well-controlled.  He presents with expected limitations in range of motion, strength, and general functional mobility.  He would benefit from skilled therapy to return to active lifestyle. OBJECTIVE IMPAIRMENTS: Abnormal gait, decreased activity tolerance, decreased mobility, difficulty walking, decreased ROM, decreased strength, and pain.   ACTIVITY LIMITATIONS: carrying, lifting, bending, standing, squatting, stairs, transfers, and locomotion level  PARTICIPATION LIMITATIONS: meal prep, cleaning, driving, shopping, community activity, occupation, and yard work  PERSONAL FACTORS: None   REHAB POTENTIAL: Excellent  CLINICAL DECISION MAKING: Stable/uncomplicated  EVALUATION COMPLEXITY: Low   GOALS: Goals reviewed with patient? Yes  SHORT TERM GOALS: Target date: 05/01/2024   Patient will increase passive left hip flexion to 90 degrees  Baseline: Goal status: INITIAL  2.  Patient will ambulate without AD without pain > 500'  Baseline:  Goal status: INITIAL  3.  Patient will be independent with base HEP  Baseline:  Goal status: INITIAL  4.  Patient will  demonstrate 30 degrees of passive hip ER  Baseline:  Goal status: INITIAL    LONG TERM GOALS: Target date: 05/29/2024    Patient will go up/down 8 steps with reciprocal pattern without pain  Baseline:  Goal status: INITIAL  2.  Patient will stand for > 40 min without pain in order to perform ADL's  Baseline:  Goal status: INITIAL  3.  Patient will ambulate community distances without pain  Baseline:  Goal status: INITIAL    PLAN:  PT FREQUENCY: 2x/week  PT DURATION: 8 weeks  PLANNED INTERVENTIONS: 97110-Therapeutic exercises, 97530- Therapeutic activity, [id number]- Neuromuscular re-education, 97535- Self Care, 02859- Manual therapy, [phone]- Gait training, [phone]- Aquatic Therapy, 97014- Electrical stimulation (unattended), 97035- Ultrasound, Patient/Family education, Stair training, Taping, Dry Needling, DME instructions, Cryotherapy, and Moist heat    PLAN FOR NEXT SESSION:  Continue per protocol    Alm JINNY Don, PT 04/27/2024, 10:31 AM

## 2024-04-28 ENCOUNTER — Encounter (HOSPITAL_BASED_OUTPATIENT_CLINIC_OR_DEPARTMENT_OTHER): Payer: Self-pay | Admitting: Physical Therapy

## 2024-04-29 ENCOUNTER — Encounter (HOSPITAL_BASED_OUTPATIENT_CLINIC_OR_DEPARTMENT_OTHER): Payer: Self-pay | Admitting: Physical Therapy

## 2024-04-29 ENCOUNTER — Ambulatory Visit (HOSPITAL_BASED_OUTPATIENT_CLINIC_OR_DEPARTMENT_OTHER): Admitting: Physical Therapy

## 2024-04-29 DIAGNOSIS — M25552 Pain in left hip: Secondary | ICD-10-CM | POA: Diagnosis not present

## 2024-04-29 DIAGNOSIS — M25652 Stiffness of left hip, not elsewhere classified: Secondary | ICD-10-CM | POA: Diagnosis not present

## 2024-04-29 DIAGNOSIS — R2689 Other abnormalities of gait and mobility: Secondary | ICD-10-CM | POA: Diagnosis not present

## 2024-04-29 NOTE — Therapy (Signed)
 OUTPATIENT PHYSICAL THERAPY LOWER EXTREMITY TREATMENT   Patient Name: Stuart Wade MRN: [id number] DOB:Nov 09, 1985, 38 y.o., male Today's Date: 04/29/2024  END OF SESSION:  PT End of Session - 04/29/24 0949     Visit Number 6    Number of Visits 16    Date for PT Re-Evaluation 05/29/24    PT Start Time 0930    PT Stop Time 1012    PT Time Calculation (min) 42 min    Activity Tolerance Patient tolerated treatment well    Behavior During Therapy [facility] for tasks assessed/performed             Past Medical History:  Diagnosis Date   Anxiety    Depression    Past Surgical History:  Procedure Laterality Date   ADENOIDECTOMY     ORIF CLAVICULAR FRACTURE Left 11/01/2020   Procedure: LEFT OPEN REDUCTION INTERNAL FIXATION (ORIF) CLAVICULAR FRACTURE;  Surgeon: Addie Cordella Hamilton, MD;  Location: [facility] OR;  Service: Orthopedics;  Laterality: Left;   TEAR DUCT PROBING     TONSILLECTOMY     WISDOM TOOTH EXTRACTION     Patient Active Problem List   Diagnosis Date Noted   Displaced fracture of shaft of left clavicle, initial encounter for closed fracture    Left hand pain 12/27/2017    PCP: Garnette Gleason MD   REFERRING PROVIDER: Dr Elspeth Parker   REFERRING DIAG: Left hip Labral repair   THERAPY DIAG:  Pain in left hip  Stiffness of left hip, not elsewhere classified  Other abnormalities of gait and mobility  Rationale for Evaluation and Treatment: Rehabilitation  ONSET DATE: 03/30/2024  SUBJECTIVE:   SUBJECTIVE STATEMENT: The patient reports he has not gone ice skating again and everything has been fine. He has had no pain. He flet good after the last visit.   The patient has a history of left hip pain. He was found to have a labrum tear. He had a repair on 03/30/2024. At this time his pain is well controlled. 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 He used the crutch for a short time but has not had to use it since.   PERTINENT HISTORY: Left clavicle  fx,  PAIN:  Are you having pain? Yes: NPRS scale: 1-2/10 right now  Pain location: left hip  Pain description: mild tightness and ache  Aggravating factors: prolonged position  Relieving factors: Movement   PRECAUTIONS: None  RED FLAGS: None   WEIGHT BEARING RESTRICTIONS: Yes WBAT  FALLS:  Has patient fallen in last 6 months? No  LIVING ENVIRONMENT:  OCCUPATION:  Works in people and culture      Hobbies/Recreation: Hockey Has his own gym in the basement  Hiking       PLOF: Independent  PATIENT GOALS:    NEXT MD VISIT:    OBJECTIVE:  Note: Objective measures were completed at Evaluation unless otherwise noted.  DIAGNOSTIC FINDINGS:    PATIENT SURVEYS:  LEFS  Extreme difficulty/unable (0), Quite a bit of difficulty (1), Moderate difficulty (2), Little difficulty (3), No difficulty (4) Survey date:    Any of your usual work, housework or school activities   2. Usual hobbies, recreational or sporting activities   3. Getting into/out of the bath   4. Walking between rooms   5. Putting on socks/shoes   6. Squatting    7. Lifting an object, like a bag of groceries from the floor   8. Performing light activities around  your home   9. Performing heavy activities around your home   10. Getting into/out of a car   11. Walking 2 blocks   12. Walking 1 mile   13. Going up/down 10 stairs (1 flight)   14. Standing for 1 hour   15.  sitting for 1 hour   16. Running on even ground   17. Running on uneven ground   18. Making sharp turns while running fast   19. Hopping    20. Rolling over in bed   Score total:  47/80     COGNITION: Overall cognitive status: Within functional limits for tasks assessed     SENSATION: WFL    POSTURE: No Significant postural limitations  PALPATION: Mild tenderness to palpation in the anterior hip   LOWER EXTREMITY ROM:  Passive ROM Right eval Left eval  Hip flexion    Hip extension    Hip abduction    Hip  adduction    Hip internal rotation    Hip external rotation    Knee flexion    Knee extension    Ankle dorsiflexion    Ankle plantarflexion    Ankle inversion    Ankle eversion     (Blank rows = not tested)  LOWER EXTREMITY MMT:  MMT Right eval Left eval  Hip flexion    Hip extension    Hip abduction    Hip adduction    Hip internal rotation    Hip external rotation    Knee flexion    Knee extension    Ankle dorsiflexion    Ankle plantarflexion    Ankle inversion    Ankle eversion     (Blank rows = not tested)   GAIT:                                                                                                                                TREATMENT DATE:  8/14 There ex: Upright bike 5 minutes  Prone hip extension 3# 3 x 10 LAQ 5 lbs 3x12   Ther-act:  Step up 3x12 4 inch  Lateral Step up 3x12 4 inch  Mini squat 3x10   Neuro-re-ed:  Step onto air-ex fwd and lateral 2x15 each   04/25/2024 There ex: Upright bike 5 minutes  Prone hip extension 3# 3 x 10 LAQ 5 lbs 3x12   Ther-act:  Step up 3x12 4 inch  Lateral Step up 3x12 4 inch   Neuro-re-ed:  Step onto air-ex fwd and lateral 2x15 each         04/20/24 There ex: Upright bike 5 minutes  Prone hip extension 3# 3 x 10 STS from hi- low table with airex pad 2 x 10  Neuro-re-ed:  SL bridges 2x 10  GTB at knees with clam 3 x 10 Sidelying clam GTB 3 x 10 Standing marhces on airex pad to fatigue       PATIENT EDUCATION:  Education details: HEP, symptom management, progression of activity. Person educated: Patient Education method: Explanation, Demonstration, Tactile cues, Verbal cues, and Handouts Education comprehension: verbalized understanding, returned demonstration, verbal cues required, tactile cues required, and needs further education  HOME EXERCISE PROGRAM:  LJWYRFPH  ASSESSMENT:  CLINICAL IMPRESSION:  He has low range of squatting to patient's program today.  He had no  significant pain.  Overall he is making great progress.  He has had no pain with any of the activity given to him.  Therapy will continue to progress as tolerated.  Patient is a 38 year old male status post left hip labral repair on 03/30/2024.  At this time his pain is well-controlled.  He presents with expected limitations in range of motion, strength, and general functional mobility.  He would benefit from skilled therapy to return to active lifestyle. OBJECTIVE IMPAIRMENTS: Abnormal gait, decreased activity tolerance, decreased mobility, difficulty walking, decreased ROM, decreased strength, and pain.   ACTIVITY LIMITATIONS: carrying, lifting, bending, standing, squatting, stairs, transfers, and locomotion level  PARTICIPATION LIMITATIONS: meal prep, cleaning, driving, shopping, community activity, occupation, and yard work  PERSONAL FACTORS: None   REHAB POTENTIAL: Excellent  CLINICAL DECISION MAKING: Stable/uncomplicated  EVALUATION COMPLEXITY: Low   GOALS: Goals reviewed with patient? Yes  SHORT TERM GOALS: Target date: 05/01/2024   Patient will increase passive left hip flexion to 90 degrees  Baseline: Goal status: INITIAL  2.  Patient will ambulate without AD without pain > 500'  Baseline:  Goal status: INITIAL  3.  Patient will be independent with base HEP  Baseline:  Goal status: INITIAL  4.  Patient will demonstrate 30 degrees of passive hip ER  Baseline:  Goal status: INITIAL    LONG TERM GOALS: Target date: 05/29/2024    Patient will go up/down 8 steps with reciprocal pattern without pain  Baseline:  Goal status: INITIAL  2.  Patient will stand for > 40 min without pain in order to perform ADL's  Baseline:  Goal status: INITIAL  3.  Patient will ambulate community distances without pain  Baseline:  Goal status: INITIAL    PLAN:  PT FREQUENCY: 2x/week  PT DURATION: 8 weeks  PLANNED INTERVENTIONS: 97110-Therapeutic exercises, 97530-  Therapeutic activity, [id number]- Neuromuscular re-education, 97535- Self Care, [id number]- Manual therapy, [phone]- Gait training, [phone]- Aquatic Therapy, 97014- Electrical stimulation (unattended), 97035- Ultrasound, Patient/Family education, Stair training, Taping, Dry Needling, DME instructions, Cryotherapy, and Moist heat    PLAN FOR NEXT SESSION:  Continue per protocol    Alm JINNY Don, PT 04/29/2024, 10:03 AM

## 2024-04-30 ENCOUNTER — Encounter (HOSPITAL_BASED_OUTPATIENT_CLINIC_OR_DEPARTMENT_OTHER): Payer: Self-pay | Admitting: Physical Therapy

## 2024-05-07 ENCOUNTER — Encounter (HOSPITAL_BASED_OUTPATIENT_CLINIC_OR_DEPARTMENT_OTHER): Payer: Self-pay | Admitting: Physical Therapy

## 2024-05-07 ENCOUNTER — Ambulatory Visit (HOSPITAL_BASED_OUTPATIENT_CLINIC_OR_DEPARTMENT_OTHER): Payer: Self-pay | Admitting: Physical Therapy

## 2024-05-07 DIAGNOSIS — M25552 Pain in left hip: Secondary | ICD-10-CM | POA: Diagnosis not present

## 2024-05-07 DIAGNOSIS — R2689 Other abnormalities of gait and mobility: Secondary | ICD-10-CM | POA: Diagnosis not present

## 2024-05-07 DIAGNOSIS — M25652 Stiffness of left hip, not elsewhere classified: Secondary | ICD-10-CM | POA: Diagnosis not present

## 2024-05-07 NOTE — Therapy (Signed)
 OUTPATIENT PHYSICAL THERAPY LOWER EXTREMITY TREATMENT   Patient Name: Stuart Wade MRN: [id number] DOB:10/05/1985, 38 y.o., male Today's Date: 05/07/2024  END OF SESSION:  PT End of Session - 05/07/24 1011     Visit Number 7    Number of Visits 16    Date for PT Re-Evaluation 05/29/24    PT Start Time 0845    PT Stop Time 0928    PT Time Calculation (min) 43 min    Activity Tolerance Patient tolerated treatment well    Behavior During Therapy [facility] for tasks assessed/performed              Past Medical History:  Diagnosis Date   Anxiety    Depression    Past Surgical History:  Procedure Laterality Date   ADENOIDECTOMY     ORIF CLAVICULAR FRACTURE Left 11/01/2020   Procedure: LEFT OPEN REDUCTION INTERNAL FIXATION (ORIF) CLAVICULAR FRACTURE;  Surgeon: Addie Cordella Hamilton, MD;  Location: [facility] OR;  Service: Orthopedics;  Laterality: Left;   TEAR DUCT PROBING     TONSILLECTOMY     WISDOM TOOTH EXTRACTION     Patient Active Problem List   Diagnosis Date Noted   Displaced fracture of shaft of left clavicle, initial encounter for closed fracture    Left hand pain 12/27/2017    PCP: Garnette Gleason MD   REFERRING PROVIDER: Dr Elspeth Parker   REFERRING DIAG: Left hip Labral repair   THERAPY DIAG:  Pain in left hip  Stiffness of left hip, not elsewhere classified  Other abnormalities of gait and mobility  Rationale for Evaluation and Treatment: Rehabilitation  ONSET DATE: 03/30/2024  SUBJECTIVE:   SUBJECTIVE STATEMENT: The patient had no pain after the last visit. He has started light lifting   The patient has a history of left hip pain. He was found to have a labrum tear. He had a repair on 03/30/2024. At this time his pain is well controlled. 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 He used the crutch for a short time but has not had to use it since.   PERTINENT HISTORY: Left clavicle fx,  PAIN:  Are you having pain? Yes: NPRS scale: 1-2/10  right now  Pain location: left hip  Pain description: mild tightness and ache  Aggravating factors: prolonged position  Relieving factors: Movement   PRECAUTIONS: None  RED FLAGS: None   WEIGHT BEARING RESTRICTIONS: Yes WBAT  FALLS:  Has patient fallen in last 6 months? No  LIVING ENVIRONMENT:  OCCUPATION:  Works in people and culture      Hobbies/Recreation: Hockey Has his own gym in the basement  Hiking       PLOF: Independent  PATIENT GOALS:    NEXT MD VISIT:    OBJECTIVE:  Note: Objective measures were completed at Evaluation unless otherwise noted.  DIAGNOSTIC FINDINGS:    PATIENT SURVEYS:  LEFS  Extreme difficulty/unable (0), Quite a bit of difficulty (1), Moderate difficulty (2), Little difficulty (3), No difficulty (4) Survey date:    Any of your usual work, housework or school activities   2. Usual hobbies, recreational or sporting activities   3. Getting into/out of the bath   4. Walking between rooms   5. Putting on socks/shoes   6. Squatting    7. Lifting an object, like a bag of groceries from the floor   8. Performing light activities around your home   9. Performing heavy activities around your home  10. Getting into/out of a car   11. Walking 2 blocks   12. Walking 1 mile   13. Going up/down 10 stairs (1 flight)   14. Standing for 1 hour   15.  sitting for 1 hour   16. Running on even ground   17. Running on uneven ground   18. Making sharp turns while running fast   19. Hopping    20. Rolling over in bed   Score total:  47/80     COGNITION: Overall cognitive status: Within functional limits for tasks assessed     SENSATION: WFL    POSTURE: No Significant postural limitations  PALPATION: Mild tenderness to palpation in the anterior hip   LOWER EXTREMITY ROM:  Passive ROM Right eval Left eval  Hip flexion    Hip extension    Hip abduction    Hip adduction    Hip internal rotation    Hip external rotation     Knee flexion    Knee extension    Ankle dorsiflexion    Ankle plantarflexion    Ankle inversion    Ankle eversion     (Blank rows = not tested)  LOWER EXTREMITY MMT:  MMT Right eval Left eval  Hip flexion    Hip extension    Hip abduction    Hip adduction    Hip internal rotation    Hip external rotation    Knee flexion    Knee extension    Ankle dorsiflexion    Ankle plantarflexion    Ankle inversion    Ankle eversion     (Blank rows = not tested)   GAIT:                                                                                                                                TREATMENT DATE:  8/21 There ex: Upright bike 5 minutes Dead Lift 3x12 elevated surface 36 lbs  Leg press 70 lbs 3x12  Knee extension 25 lbs 3x12    Neuro-re-ed:  Air-ex:  Fwd and lateral hop 2x20  Re bounder 2x20 blue ball  TRX 2x12         8/14 There ex: Upright bike 5 minutes  Prone hip extension 3# 3 x 10 LAQ 5 lbs 3x12   Ther-act:  Step up 3x12 4 inch  Lateral Step up 3x12 4 inch  Mini squat 3x10   Neuro-re-ed:  Step onto air-ex fwd and lateral 2x15 each   04/25/2024 There ex: Upright bike 5 minutes  Prone hip extension 3# 3 x 10 LAQ 5 lbs 3x12   Ther-act:  Step up 3x12 4 inch  Lateral Step up 3x12 4 inch   Neuro-re-ed:  Step onto air-ex fwd and lateral 2x15 each         04/20/24 There ex: Upright bike 5 minutes  Prone hip extension 3# 3 x 10 STS from hi- low table with airex  pad 2 x 10  Neuro-re-ed:  SL bridges 2x 10  GTB at knees with clam 3 x 10 Sidelying clam GTB 3 x 10 Standing marhces on airex pad to fatigue       PATIENT EDUCATION:  Education details: HEP, symptom management, progression of activity. Person educated: Patient Education method: Explanation, Demonstration, Tactile cues, Verbal cues, and Handouts Education comprehension: verbalized understanding, returned demonstration, verbal cues required, tactile cues  required, and needs further education  HOME EXERCISE PROGRAM:  LJWYRFPH  ASSESSMENT:  CLINICAL IMPRESSION:  The patient is making excellent progress. We reviewed gym exercises as well as stability exercises. He reported no pain. He will work on these exercises at home.    Patient is a 38 year old male status post left hip labral repair on 03/30/2024.  At this time his pain is well-controlled.  He presents with expected limitations in range of motion, strength, and general functional mobility.  He would benefit from skilled therapy to return to active lifestyle. OBJECTIVE IMPAIRMENTS: Abnormal gait, decreased activity tolerance, decreased mobility, difficulty walking, decreased ROM, decreased strength, and pain.   ACTIVITY LIMITATIONS: carrying, lifting, bending, standing, squatting, stairs, transfers, and locomotion level  PARTICIPATION LIMITATIONS: meal prep, cleaning, driving, shopping, community activity, occupation, and yard work  PERSONAL FACTORS: None   REHAB POTENTIAL: Excellent  CLINICAL DECISION MAKING: Stable/uncomplicated  EVALUATION COMPLEXITY: Low   GOALS: Goals reviewed with patient? Yes  SHORT TERM GOALS: Target date: 05/01/2024   Patient will increase passive left hip flexion to 90 degrees  Baseline: Goal status: INITIAL  2.  Patient will ambulate without AD without pain > 500'  Baseline:  Goal status: INITIAL  3.  Patient will be independent with base HEP  Baseline:  Goal status: INITIAL  4.  Patient will demonstrate 30 degrees of passive hip ER  Baseline:  Goal status: INITIAL    LONG TERM GOALS: Target date: 05/29/2024    Patient will go up/down 8 steps with reciprocal pattern without pain  Baseline:  Goal status: INITIAL  2.  Patient will stand for > 40 min without pain in order to perform ADL's  Baseline:  Goal status: INITIAL  3.  Patient will ambulate community distances without pain  Baseline:  Goal status:  INITIAL    PLAN:  PT FREQUENCY: 2x/week  PT DURATION: 8 weeks  PLANNED INTERVENTIONS: 97110-Therapeutic exercises, 97530- Therapeutic activity, [id number]- Neuromuscular re-education, 97535- Self Care, 02859- Manual therapy, [phone]- Gait training, [phone]- Aquatic Therapy, 97014- Electrical stimulation (unattended), 97035- Ultrasound, Patient/Family education, Stair training, Taping, Dry Needling, DME instructions, Cryotherapy, and Moist heat    PLAN FOR NEXT SESSION:  Continue per protocol    Alm JINNY Don, PT 05/07/2024, 10:12 AM

## 2024-05-11 ENCOUNTER — Ambulatory Visit (INDEPENDENT_AMBULATORY_CARE_PROVIDER_SITE_OTHER): Admitting: Family Medicine

## 2024-05-11 ENCOUNTER — Encounter: Payer: Self-pay | Admitting: Family Medicine

## 2024-05-11 VITALS — BP 111/71 | HR 60 | Temp 98.1°F | Ht 73.0 in | Wt 221.0 lb

## 2024-05-11 DIAGNOSIS — G479 Sleep disorder, unspecified: Secondary | ICD-10-CM | POA: Insufficient documentation

## 2024-05-11 DIAGNOSIS — Z7689 Persons encountering health services in other specified circumstances: Secondary | ICD-10-CM

## 2024-05-11 DIAGNOSIS — Z0000 Encounter for general adult medical examination without abnormal findings: Secondary | ICD-10-CM | POA: Insufficient documentation

## 2024-05-11 NOTE — Progress Notes (Signed)
 Complete physical exam  Patient: Stuart Wade Ranker   DOB: 23-May-1986   37 y.o. Male  MRN: [id number]  Subjective:    Chief Complaint  Patient presents with   Establish Care  Wants CPE today.   Stuart Wade is a 38 y.o. male who presents today for a complete physical exam. He reports consuming a general diet. Ice hockey twice per week, walking, and weight lifting.  He generally feels well. He reports sleeping poorly. He does not have additional problems to discuss today.    Most recent fall risk assessment:    05/11/2024    1:45 PM  Fall Risk   Falls in the past year? 0  Number falls in past yr: 0  Injury with Fall? 0  Follow up Falls evaluation completed     Most recent depression screenings:    05/11/2024    1:44 PM  PHQ 2/9 Scores  PHQ - 2 Score 0  PHQ- 9 Score 2    Vision:Within last year and Dental: No current dental problems and Receives regular dental care    Patient Care Team: Booker Stuart SAUNDERS, FNP as PCP - General (Family Medicine)   Outpatient Medications Prior to Visit  Medication Sig   aspirin  EC 325 MG tablet Take 1 tablet (325 mg total) by mouth daily. (Patient not taking: Reported on 05/11/2024)   aspirin  EC 325 MG tablet Take 1 tablet (325 mg total) by mouth daily. (Patient not taking: Reported on 05/11/2024)   b complex vitamins capsule Take 1 capsule by mouth daily. (Patient not taking: Reported on 05/11/2024)   oxyCODONE  (ROXICODONE ) 5 MG immediate release tablet Take 1 tablet (5 mg total) by mouth every 4 (four) hours as needed for severe pain (pain score 7-10) or breakthrough pain. (Patient not taking: Reported on 05/11/2024)   oxyCODONE  (ROXICODONE ) 5 MG immediate release tablet Take 1 tablet (5 mg total) by mouth every 4 (four) hours as needed for severe pain (pain score 7-10) or breakthrough pain. (Patient not taking: Reported on 05/11/2024)   [DISCONTINUED] ketorolac  (TORADOL ) 10 MG tablet Take 1 tablet (10 mg total) by mouth every 8 (eight) hours as  needed.   [DISCONTINUED] methocarbamol  (ROBAXIN ) 500 MG tablet Take 1 tablet (500 mg total) by mouth every 8 (eight) hours as needed.   No facility-administered medications prior to visit.    ROS        Objective:     BP 111/71 (BP Location: Left Arm, Patient Position: Sitting, Cuff Size: Normal)   Pulse 60   Temp 98.1 F (36.7 C) (Oral)   Ht 6' 1 (1.854 m)   Wt 221 lb (100.2 kg)   SpO2 100%   BMI 29.16 kg/m    Physical Exam Vitals and nursing note reviewed.  Constitutional:      General: He is not in acute distress.    Appearance: Normal appearance.  HENT:     Right Ear: Tympanic membrane normal.     Left Ear: Tympanic membrane normal.     Nose: Nose normal.     Mouth/Throat:     Mouth: Mucous membranes are moist.     Pharynx: Oropharynx is clear.  Eyes:     Extraocular Movements: Extraocular movements intact.  Neck:     Thyroid: No thyroid tenderness.  Cardiovascular:     Rate and Rhythm: Normal rate and regular rhythm.     Pulses:          Radial pulses are 2+ on the  right side and 2+ on the left side.     Heart sounds: Normal heart sounds, S1 normal and S2 normal.  Pulmonary:     Effort: Pulmonary effort is normal.     Breath sounds: Normal breath sounds.  Abdominal:     General: Bowel sounds are normal.     Palpations: Abdomen is soft.     Tenderness: There is no abdominal tenderness.  Musculoskeletal:        General: Normal range of motion.     Cervical back: Normal range of motion.     Right lower leg: No edema.     Left lower leg: No edema.  Lymphadenopathy:     Cervical:     Right cervical: No superficial cervical adenopathy.    Left cervical: No superficial cervical adenopathy.  Skin:    General: Skin is warm and dry.  Neurological:     General: No focal deficit present.     Mental Status: He is alert. Mental status is at baseline.  Psychiatric:        Mood and Affect: Mood normal.        Behavior: Behavior normal.        Thought  Content: Thought content normal.        Judgment: Judgment normal.      No results found for any visits on 05/11/24.     Assessment & Plan:    Routine Health Maintenance and Physical Exam  Immunization History  Administered Date(s) Administered   PFIZER Comirnaty(Gray Top)Covid-19 Tri-Sucrose Vaccine 10/15/2019, 10/23/2019, 08/05/2020   PFIZER(Purple Top)SARS-COV-2 Vaccination 10/05/2019, 10/23/2019   Td 06/01/2020   Tdap 02/23/2008    Health Maintenance  Topic Date Due   HIV Screening  Never done   Hepatitis C Screening  Never done   Hepatitis B Vaccines 19-59 Average Risk (1 of 3 - 19+ 3-dose series) Never done   HPV VACCINES (1 - 3-dose SCDM series) Never done   COVID-19 Vaccine (6 - 2024-25 season) 05/19/2023   INFLUENZA VACCINE  04/17/2024   DTaP/Tdap/Td (3 - Td or Tdap) 06/01/2030   Pneumococcal Vaccine  Aged Out   Meningococcal B Vaccine  Aged Out    Discussed health benefits of physical activity, and encouraged him to engage in regular exercise appropriate for his age and condition.  Problem List Items Addressed This Visit     Annual physical exam - Primary    Routine labs declined today.  HCM reviewed. [employer] employee required to ger flu vaccine yearly. Very active.  Recommend healthy diet.  Recommend approximately 150 minutes/week of moderate intensity exercise. Resistance training is good for building muscles and for bone health. Muscle mass helps to increase our metabolism and to burn more calories at rest.  Limit alcohol consumption: no more than one drink per day for women and 2 drinks per day for me. Recommend regular dental and vision exams. Always use seatbelt/lap and shoulder restraints. Recommend using smoke alarms and checking batteries at least twice a year. Recommend using sunscreen when outside.  Agrees with plan of care discussed.  Questions answered.      Return in about 1 year (around 05/12/2025) for CPE with labs.     Stuart JONELLE Brownie,  FNP

## 2024-05-20 ENCOUNTER — Ambulatory Visit (HOSPITAL_BASED_OUTPATIENT_CLINIC_OR_DEPARTMENT_OTHER): Admitting: Orthopaedic Surgery

## 2024-05-20 DIAGNOSIS — S73192A Other sprain of left hip, initial encounter: Secondary | ICD-10-CM

## 2024-05-20 NOTE — Progress Notes (Signed)
 Post Operative Evaluation    Procedure/Date of Surgery: Left hip labral repair 7/14  Interval History:   Presents 6 weeks status post the above procedure.  Overall he is doing extremely well.  He is getting back to most activities without pain.  Physical therapy is going well   PMH/PSH/Family History/Social History/Meds/Allergies:    Past Medical History:  Diagnosis Date   Anxiety    Depression    Past Surgical History:  Procedure Laterality Date   ADENOIDECTOMY     HIP ARTHROPLASTY     ORIF CLAVICULAR FRACTURE Left 11/01/2020   Procedure: LEFT OPEN REDUCTION INTERNAL FIXATION (ORIF) CLAVICULAR FRACTURE;  Surgeon: Addie Cordella Hamilton, MD;  Location: [facility] OR;  Service: Orthopedics;  Laterality: Left;   TEAR DUCT PROBING     TONSILLECTOMY     WISDOM TOOTH EXTRACTION     Social History   Socioeconomic History   Marital status: Married    Spouse name: Not on file   Number of children: 0   Years of education: Not on file   Highest education level: Not on file  Occupational History   Not on file  Tobacco Use   Smoking status: Never   Smokeless tobacco: Never  Vaping Use   Vaping status: Never Used  Substance and Sexual Activity   Alcohol use: Yes    Alcohol/week: 2.0 standard drinks of alcohol    Types: 2 Cans of beer per week    Comment: occ   Drug use: Not on file   Sexual activity: Yes    Partners: Female    Birth control/protection: None  Other Topics Concern   Not on file  Social History Narrative   Not on file   Social Drivers of Health   Financial Resource Strain: Low Risk  (05/11/2024)   Overall Financial Resource Strain (CARDIA)    Difficulty of Paying Living Expenses: Not hard at all  Food Insecurity: No Food Insecurity (05/11/2024)   Hunger Vital Sign    Worried About Running Out of Food in the Last Year: Never true    Ran Out of Food in the Last Year: Never true  Transportation Needs: No Transportation Needs  (05/11/2024)   PRAPARE - Administrator, Civil Service (Medical): No    Lack of Transportation (Non-Medical): No  Physical Activity: Sufficiently Active (05/11/2024)   Exercise Vital Sign    Days of Exercise per Week: 3 days    Minutes of Exercise per Session: 60 min  Stress: No Stress Concern Present (05/11/2024)   [employer] of Occupational Health - Occupational Stress Questionnaire    Feeling of Stress: Not at all  Social Connections: Moderately Integrated (05/11/2024)   Social Connection and Isolation Panel    Frequency of Communication with Friends and Family: Three times a week    Frequency of Social Gatherings with Friends and Family: Twice a week    Attends Religious Services: Never    Database administrator or Organizations: Yes    Attends Banker Meetings: 1 to 4 times per year    Marital Status: Married   No family history on file. No Known Allergies Current Outpatient Medications  Medication Sig Dispense Refill   aspirin  EC 325 MG tablet Take 1 tablet (325 mg total) by mouth daily. (Patient not taking:  Reported on 05/11/2024) 14 tablet 0   aspirin  EC 325 MG tablet Take 1 tablet (325 mg total) by mouth daily. (Patient not taking: Reported on 05/11/2024) 14 tablet 0   b complex vitamins capsule Take 1 capsule by mouth daily. (Patient not taking: Reported on 05/11/2024)     oxyCODONE  (ROXICODONE ) 5 MG immediate release tablet Take 1 tablet (5 mg total) by mouth every 4 (four) hours as needed for severe pain (pain score 7-10) or breakthrough pain. (Patient not taking: Reported on 05/11/2024) 10 tablet 0   oxyCODONE  (ROXICODONE ) 5 MG immediate release tablet Take 1 tablet (5 mg total) by mouth every 4 (four) hours as needed for severe pain (pain score 7-10) or breakthrough pain. (Patient not taking: Reported on 05/11/2024) 5 tablet 0   No current facility-administered medications for this visit.   No results found.  Review of Systems:   A ROS was  performed including pertinent positives and negatives as documented in the HPI.   Musculoskeletal Exam:    There were no vitals taken for this visit.  Hip incision is well-appearing without erythema or drainage.  30 degrees internal/external rotation left hip without pain.  Good abduction strength is neurosensory exam is intact  Imaging:      I personally reviewed and interpreted the radiographs.   Assessment:   Status post left hip arthroscopic labral pair overall doing very well.  At this time he will get back to skating and ultimately ice hockey.  I will plan to see him back as needed  Plan :    - Return to clinic as needed      I personally saw and evaluated the patient, and participated in the management and treatment plan.  Elspeth Parker, MD Attending Physician, Orthopedic Surgery  This document was dictated using Dragon voice recognition software. A reasonable attempt at proof reading has been made to minimize errors.

## 2024-05-21 ENCOUNTER — Encounter (HOSPITAL_BASED_OUTPATIENT_CLINIC_OR_DEPARTMENT_OTHER): Payer: Self-pay | Admitting: Physical Therapy

## 2024-07-20 ENCOUNTER — Encounter: Payer: Self-pay | Admitting: Radiology
